# Patient Record
Sex: Female | Born: 1955
Health system: Southern US, Community
[De-identification: ages and names within clinical notes are randomized; demographics above are authoritative.]

## PROBLEM LIST (undated history)

## (undated) DIAGNOSIS — G4733 Obstructive sleep apnea (adult) (pediatric): Secondary | ICD-10-CM

## (undated) DIAGNOSIS — M797 Fibromyalgia: Secondary | ICD-10-CM

## (undated) DIAGNOSIS — K2960 Other gastritis without bleeding: Secondary | ICD-10-CM

## (undated) DIAGNOSIS — K219 Gastro-esophageal reflux disease without esophagitis: Secondary | ICD-10-CM

## (undated) DIAGNOSIS — K8020 Calculus of gallbladder without cholecystitis without obstruction: Secondary | ICD-10-CM

## (undated) DIAGNOSIS — Z86718 Personal history of other venous thrombosis and embolism: Secondary | ICD-10-CM

## (undated) DIAGNOSIS — K08109 Complete loss of teeth, unspecified cause, unspecified class: Secondary | ICD-10-CM

## (undated) DIAGNOSIS — R1310 Dysphagia, unspecified: Secondary | ICD-10-CM

## (undated) DIAGNOSIS — Z972 Presence of dental prosthetic device (complete) (partial): Secondary | ICD-10-CM

## (undated) DIAGNOSIS — H7409 Tympanosclerosis, unspecified ear: Secondary | ICD-10-CM

## (undated) DIAGNOSIS — G4700 Insomnia, unspecified: Secondary | ICD-10-CM

## (undated) DIAGNOSIS — R000 Tachycardia, unspecified: Secondary | ICD-10-CM

## (undated) DIAGNOSIS — I209 Angina pectoris, unspecified: Secondary | ICD-10-CM

## (undated) DIAGNOSIS — E785 Hyperlipidemia, unspecified: Secondary | ICD-10-CM

## (undated) DIAGNOSIS — K581 Irritable bowel syndrome with constipation: Secondary | ICD-10-CM

## (undated) DIAGNOSIS — M1990 Unspecified osteoarthritis, unspecified site: Secondary | ICD-10-CM

## (undated) DIAGNOSIS — R0789 Other chest pain: Secondary | ICD-10-CM

## (undated) DIAGNOSIS — F419 Anxiety disorder, unspecified: Secondary | ICD-10-CM

## (undated) DIAGNOSIS — I499 Cardiac arrhythmia, unspecified: Secondary | ICD-10-CM

## (undated) HISTORY — PX: OTHER SURGICAL HISTORY: SHX169

## (undated) HISTORY — PX: ABDOMINAL HYSTERECTOMY: SHX81

---

## 1996-08-24 HISTORY — PX: TOTAL VAGINAL HYSTERECTOMY: SHX2548

## 2013-08-24 HISTORY — PX: JOINT REPLACEMENT: SHX530

## 2013-08-24 HISTORY — PX: TOTAL KNEE ARTHROPLASTY: SHX125

## 2016-08-19 ENCOUNTER — Emergency Department (HOSPITAL_BASED_OUTPATIENT_CLINIC_OR_DEPARTMENT_OTHER)
Admission: EM | Admit: 2016-08-19 | Discharge: 2016-08-20 | Disposition: A | Discharge status: Home / Self Care | Payer: BLUE CROSS/BLUE SHIELD | Attending: Emergency Medicine | Admitting: Emergency Medicine

## 2016-08-19 ENCOUNTER — Emergency Department (HOSPITAL_BASED_OUTPATIENT_CLINIC_OR_DEPARTMENT_OTHER): Payer: BLUE CROSS/BLUE SHIELD

## 2016-08-19 ENCOUNTER — Encounter (HOSPITAL_BASED_OUTPATIENT_CLINIC_OR_DEPARTMENT_OTHER): Payer: Self-pay

## 2016-08-19 DIAGNOSIS — S0990XA Unspecified injury of head, initial encounter: Secondary | ICD-10-CM | POA: Diagnosis present

## 2016-08-19 DIAGNOSIS — S0101XA Laceration without foreign body of scalp, initial encounter: Secondary | ICD-10-CM | POA: Insufficient documentation

## 2016-08-19 DIAGNOSIS — W108XXA Fall (on) (from) other stairs and steps, initial encounter: Secondary | ICD-10-CM | POA: Insufficient documentation

## 2016-08-19 DIAGNOSIS — Y999 Unspecified external cause status: Secondary | ICD-10-CM | POA: Insufficient documentation

## 2016-08-19 DIAGNOSIS — Z96651 Presence of right artificial knee joint: Secondary | ICD-10-CM | POA: Insufficient documentation

## 2016-08-19 DIAGNOSIS — S82861A Displaced Maisonneuve's fracture of right leg, initial encounter for closed fracture: Secondary | ICD-10-CM

## 2016-08-19 DIAGNOSIS — Y929 Unspecified place or not applicable: Secondary | ICD-10-CM | POA: Diagnosis not present

## 2016-08-19 DIAGNOSIS — Y939 Activity, unspecified: Secondary | ICD-10-CM | POA: Insufficient documentation

## 2016-08-19 DIAGNOSIS — S60511A Abrasion of right hand, initial encounter: Secondary | ICD-10-CM | POA: Insufficient documentation

## 2016-08-19 DIAGNOSIS — W19XXXA Unspecified fall, initial encounter: Secondary | ICD-10-CM

## 2016-08-19 HISTORY — DX: Fibromyalgia: M79.7

## 2016-08-19 HISTORY — DX: Tachycardia, unspecified: R00.0

## 2016-08-19 MED ORDER — LIDOCAINE-EPINEPHRINE (PF) 2 %-1:200000 IJ SOLN: 10.0000 mL | Freq: Once | INTRAMUSCULAR | Status: DC

## 2016-08-19 MED ORDER — OXYCODONE HCL 5 MG PO TABS: 5.0000 mg | ORAL_TABLET | ORAL | 0 refills | Status: DC | PRN

## 2016-08-19 MED ORDER — LIDOCAINE HCL (PF) 2 % IJ SOLN
INTRAMUSCULAR | Status: AC
  Filled 2016-08-19: qty 2

## 2016-08-19 MED ORDER — ACETAMINOPHEN 500 MG PO TABS
1000.0000 mg | ORAL_TABLET | Freq: Once | ORAL | Status: AC
  Administered 2016-08-19: 1000 mg via ORAL
  Filled 2016-08-19: qty 2

## 2016-08-19 MED ORDER — LIDOCAINE HCL 2 % IJ SOLN
INTRAMUSCULAR | Status: AC
  Filled 2016-08-19: qty 20

## 2016-08-19 MED ORDER — LIDOCAINE-EPINEPHRINE-TETRACAINE (LET) SOLUTION
NASAL | Status: AC
  Filled 2016-08-19: qty 3

## 2016-08-19 NOTE — ED Notes (Signed)
Patient transported to X-ray 

## 2016-08-19 NOTE — ED Triage Notes (Signed)
Pt fell down approximately two steps and skinned her knees and hit the top of her head around 1900.  No LOC, not on blood thinners, has an inch long laceration to top of scalp.

## 2016-08-19 NOTE — ED Notes (Signed)
ED Provider at bedside. 

## 2016-08-19 NOTE — ED Provider Notes (Addendum)
[facility] Provider Note   CSN: [id number] Arrival date & time: 08/19/16  2036  By signing my name below, I, Elizabeth NeighborsMaurice Deon Copeland Jr., attest that this documentation has been prepared under the direction and in the presence of Lyndal Pulleyaniel Kayvon Mo, MD. Electronically signed: [name], ED Scribe. 08/19/16. 9:34 PM.    History   Chief Complaint Chief Complaint  Patient presents with  . Fall    HPI . HPI Comments: Elizabeth Conner is a 60 y.o. female who presents to the Emergency Department for evaluation of a head injury s/p mechanical fall. Pt states that she tripped down stairs x2 hours ago. She states that she landed head first onto the cement pavement and upon impact she saw "bright spots." Pt reports nausea, headache and neck pain. No vomiting, no confusion, no deficits. Also has right knee pain after the fall but has been ambulatory. Pt has taken Ibuprofen with no relief. Pt denies LOC, blood thinner use.    The history is provided by the patient. No language interpreter was used.    Past Medical History:  Diagnosis Date  . Fibromyalgia   . Tachycardia     There are no active problems to display for this patient.   Past Surgical History:  Procedure Laterality Date  . ABDOMINAL HYSTERECTOMY    . CESAREAN SECTION    . JOINT REPLACEMENT  2015   right knee    OB History    No data available       Home Medications    Prior to Admission medications   Not on File    Family History No family history on file.  Social History Social History  Substance Use Topics  . Smoking status: Never Smoker  . Smokeless tobacco: Never Used  . Alcohol use No     Allergies   Patient has no known allergies.   Review of Systems Review of Systems  Constitutional: Negative for chills and fever.  HENT: Negative for ear pain and sore throat.   Eyes: Negative for pain and visual disturbance.  Respiratory: Negative for cough and shortness of breath.    Cardiovascular: Negative for chest pain and palpitations.  Gastrointestinal: Positive for nausea. Negative for abdominal pain and vomiting.  Genitourinary: Negative for dysuria and hematuria.  Musculoskeletal: Positive for neck pain. Negative for arthralgias and back pain.  Skin: Positive for wound. Negative for color change and rash.  Neurological: Positive for headaches. Negative for seizures and syncope.  All other systems reviewed and are negative.    Physical Exam Updated Vital Signs BP 137/79 (BP Location: Left Arm)   Pulse 88   Temp 98.7 F (37.1 C) (Oral)   Resp 18   Ht 5\' 2"  (1.575 m)   Wt 150 lb (68 kg)   SpO2 100%   BMI 27.44 kg/m   Physical Exam  Constitutional: She is oriented to person, place, and time. She appears well-developed and well-nourished. No distress.  HENT:  Head: Normocephalic. Head is with laceration (to superior scalp, 2 cm without underlying hematoma ).  Nose: Nose normal.  Eyes: Conjunctivae and EOM are normal.  Neck: Neck supple. No tracheal deviation present.  Cardiovascular: Normal rate and regular rhythm.   Pulmonary/Chest: Effort normal. No respiratory distress.  Abdominal: Soft. She exhibits no distension.  Musculoskeletal:  Right lateral knee tenderness  Neurological: She is alert and oriented to person, place, and time. No cranial nerve deficit or sensory deficit. She exhibits normal muscle tone.  Skin:  Skin is warm and dry. Abrasion noted.  Bilateral anterior knee abrasions, scattered abrasions to the R hand with a lateral aspect abrasion of the R pinky that is 2 cm across.  Psychiatric: She has a normal mood and affect.     ED Treatments / Results   DIAGNOSTIC STUDIES: Oxygen Saturation is 100% on RA, normal by my interpretation.   COORDINATION OF CARE: 9:35 PM-Discussed next steps with pt. Pt verbalized understanding and is agreeable with the plan.    Labs (all labs ordered are listed, but only abnormal results are  displayed) Labs Reviewed - No data to display  EKG  EKG Interpretation None       Radiology Dg Knee Complete 4 Views Right  Result Date: 08/19/2016 CLINICAL DATA:  Right knee pain after fall tonight down stairs. EXAM: RIGHT KNEE - COMPLETE 4+ VIEW COMPARISON:  None. FINDINGS: Status post right total knee arthroplasty. The femoral and tibial components appear to be well situated. Probable minimally displaced fracture is seen involving the proximal right fibular head. No dislocation or joint effusion is noted. No soft tissue abnormality is noted. IMPRESSION: Status post right total knee arthroplasty. Probable minimally displaced fracture seen involving proximal right fibular head. Electronically Signed   By: Lupita Raider, M.D.   On: 08/19/2016 22:02    Procedures Procedures (including critical care time)   LACERATION REPAIR Performed by: Lyndal Pulley Authorized by: Lyndal Pulley Consent: Verbal consent obtained. Risks and benefits: risks, benefits and alternatives were discussed Consent given by: patient Patient identity confirmed: provided demographic data Prepped and Draped in normal sterile fashion Wound explored  Laceration Location: superior scalp  Laceration Length: 2 cm  No Foreign Bodies seen or palpated  Anesthesia: local infiltration  Local anesthetic: lidocaine 2 % wo epinephrine  Anesthetic total: 5 ml  Irrigation method: syringe Amount of cleaning: standard  Skin closure: staples  Number of sutures: 2  Technique: simple  Patient tolerance: Patient tolerated the procedure well with no immediate complications.   Medications Ordered in ED Medications  lidocaine-EPINEPHrine (XYLOCAINE W/EPI) 2 %-1:200000 (PF) injection 10 mL (not administered)  lidocaine (XYLOCAINE) 2 % injection (not administered)  lidocaine (XYLOCAINE) 2 % (with pres) injection (not administered)  acetaminophen (TYLENOL) tablet 1,000 mg (1,000 mg Oral Given 08/19/16 2146)    oxyCODONE (Oxy IR/ROXICODONE) immediate release tablet 5 mg (5 mg Oral Given 08/20/16 0006)     Initial Impression / Assessment and Plan / ED Course  I have reviewed the triage vital signs and the nursing notes.  Pertinent labs & imaging results that were available during my care of the patient were reviewed by me and considered in my medical decision making (see chart for details).  Clinical Course     60 y.o. female presents with fall after missing a step to concrete. No LOC. No scalp hematoma but sustained small laceration. Has ho b/l knee replacement and now has right knee pain. Small proximal fibular fracture noted on plain film. Considering pt's GCS of 15, lack of scalp hematoma, lack of evidence of skull base fracture, and lack of vomiting or altered mental status, I do not believe that further imaging is warranted in this pt.  Pt has no neurologic deficit.  Laceration was thoroughly irrigated and repaired without complication.  Pt is to follow up with healthcare provider in ten days for removal of staples.  Proper wound management counseling was administered. 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 Placed in  knee immobilizer for comfort. Return precautions discussed for worsening or new concerning symptoms.   Final Clinical Impressions(s) / ED Diagnoses   Final diagnoses:  Laceration of scalp, initial encounter  Fall from standing, initial encounter  Closed displaced Maisonneuve fracture of right lower extremity, initial encounter    New Prescriptions New Prescriptions   No medications on file   I personally performed the services described in this documentation, which was scribed in my presence. The recorded information has been reviewed and is accurate.      Lyndal Pulleyaniel Jaice Lague, MD 08/20/16 [id number]    Lyndal Pulleyaniel Aulton Routt, MD 08/20/16 [phone]

## 2016-08-20 MED ORDER — OXYCODONE HCL 5 MG PO TABS
5.0000 mg | ORAL_TABLET | Freq: Once | ORAL | Status: AC
  Administered 2016-08-20: 5 mg via ORAL
  Filled 2016-08-20: qty 1

## 2020-02-22 ENCOUNTER — Emergency Department (HOSPITAL_BASED_OUTPATIENT_CLINIC_OR_DEPARTMENT_OTHER): Payer: BLUE CROSS/BLUE SHIELD

## 2020-02-22 ENCOUNTER — Other Ambulatory Visit: Payer: Self-pay

## 2020-02-22 ENCOUNTER — Emergency Department (HOSPITAL_BASED_OUTPATIENT_CLINIC_OR_DEPARTMENT_OTHER)
Admission: EM | Admit: 2020-02-22 | Discharge: 2020-02-22 | Disposition: A | Discharge status: Home / Self Care | Payer: BLUE CROSS/BLUE SHIELD | Attending: Emergency Medicine | Admitting: Emergency Medicine

## 2020-02-22 ENCOUNTER — Encounter (HOSPITAL_BASED_OUTPATIENT_CLINIC_OR_DEPARTMENT_OTHER): Payer: Self-pay

## 2020-02-22 DIAGNOSIS — R0602 Shortness of breath: Secondary | ICD-10-CM | POA: Insufficient documentation

## 2020-02-22 DIAGNOSIS — E785 Hyperlipidemia, unspecified: Secondary | ICD-10-CM | POA: Insufficient documentation

## 2020-02-22 DIAGNOSIS — R079 Chest pain, unspecified: Secondary | ICD-10-CM | POA: Diagnosis not present

## 2020-02-22 DIAGNOSIS — R42 Dizziness and giddiness: Secondary | ICD-10-CM | POA: Diagnosis not present

## 2020-02-22 HISTORY — DX: Hyperlipidemia, unspecified: E78.5

## 2020-02-22 HISTORY — DX: Dysphagia, unspecified: R13.10

## 2020-02-22 HISTORY — DX: Tympanosclerosis, unspecified ear: H74.09

## 2020-02-22 HISTORY — DX: Unspecified osteoarthritis, unspecified site: M19.90

## 2020-02-22 HISTORY — DX: Other gastritis without bleeding: K29.60

## 2020-02-22 HISTORY — DX: Insomnia, unspecified: G47.00

## 2020-02-22 HISTORY — DX: Obstructive sleep apnea (adult) (pediatric): G47.33

## 2020-02-22 LAB — CBC WITH DIFFERENTIAL/PLATELET
Abs Immature Granulocytes: 0.03 10*3/uL (ref 0.00–0.07)
Basophils Absolute: 0.1 10*3/uL (ref 0.0–0.1)
Basophils Relative: 1 %
Eosinophils Absolute: 0.1 10*3/uL (ref 0.0–0.5)
Eosinophils Relative: 1 %
HCT: 41.9 % (ref 36.0–46.0)
Hemoglobin: 13.7 g/dL (ref 12.0–15.0)
Immature Granulocytes: 0 %
Lymphocytes Relative: 34 %
Lymphs Abs: 2.7 10*3/uL (ref 0.7–4.0)
MCH: 30.2 pg (ref 26.0–34.0)
MCHC: 32.7 g/dL (ref 30.0–36.0)
MCV: 92.5 fL (ref 80.0–100.0)
Monocytes Absolute: 0.6 10*3/uL (ref 0.1–1.0)
Monocytes Relative: 8 %
Neutro Abs: 4.5 10*3/uL (ref 1.7–7.7)
Neutrophils Relative %: 56 %
Platelets: 321 10*3/uL (ref 150–400)
RBC: 4.53 MIL/uL (ref 3.87–5.11)
RDW: 12.5 % (ref 11.5–15.5)
WBC: 8 10*3/uL (ref 4.0–10.5)
nRBC: 0 % (ref 0.0–0.2)

## 2020-02-22 LAB — COMPREHENSIVE METABOLIC PANEL
ALT: 19 U/L (ref 0–44)
AST: 20 U/L (ref 15–41)
Albumin: 4 g/dL (ref 3.5–5.0)
Alkaline Phosphatase: 71 U/L (ref 38–126)
Anion gap: 11 (ref 5–15)
BUN: 10 mg/dL (ref 8–23)
CO2: 24 mmol/L (ref 22–32)
Calcium: 9.2 mg/dL (ref 8.9–10.3)
Chloride: 103 mmol/L (ref 98–111)
Creatinine, Ser: 0.72 mg/dL (ref 0.44–1.00)
GFR calc Af Amer: >60 mL/min (ref >60)
GFR calc non Af Amer: >60 mL/min (ref >60)
Glucose, Bld: 87 mg/dL (ref 70–99)
Potassium: 4.3 mmol/L (ref 3.5–5.1)
Sodium: 138 mmol/L (ref 135–145)
Total Bilirubin: 0.7 mg/dL (ref 0.3–1.2)
Total Protein: 7.7 g/dL (ref 6.5–8.1)

## 2020-02-22 LAB — URINALYSIS, ROUTINE W REFLEX MICROSCOPIC
Bilirubin Urine: NEGATIVE
Glucose, UA: NEGATIVE mg/dL
Ketones, ur: NEGATIVE mg/dL
Leukocytes,Ua: NEGATIVE
Nitrite: NEGATIVE
Protein, ur: NEGATIVE mg/dL
Specific Gravity, Urine: 1.025 (ref 1.005–1.030)
pH: 5.5 (ref 5.0–8.0)

## 2020-02-22 LAB — URINALYSIS, MICROSCOPIC (REFLEX)

## 2020-02-22 LAB — TROPONIN I (HIGH SENSITIVITY): Troponin I (High Sensitivity): <2 ng/L (ref <18)

## 2020-02-22 MED ORDER — MECLIZINE HCL 25 MG PO TABS: 25.0000 mg | ORAL_TABLET | Freq: Three times a day (TID) | ORAL | 0 refills | Status: DC | PRN

## 2020-02-22 MED FILL — MECLIZINE 25 MG TABLET: 25 | 3 days supply | Qty: 10 | Fill #0

## 2020-02-22 NOTE — ED Triage Notes (Signed)
Pt c/o dizziness, SOB x 1 week-states she was sent from [facility]-steady gait

## 2020-02-22 NOTE — ED Notes (Signed)
Patient transported to X-ray 

## 2020-02-22 NOTE — Discharge Instructions (Addendum)
You were seen in the emergency department today for dizziness, chest pain, and trouble breathing.  Your work-up was overall reassuring.  Your labs did not show any significant abnormalities.  Your head CT did not show any significant acute abnormalities and elevated your chest x-ray.  We are unsure of the exact cause of your multiple symptoms.  Would like you to try taking meclizine every 8 hours as needed to help with dizziness, this does not seem to help with your symptoms please discontinue taking it.  We have prescribed you new medication(s) today. Discuss the medications prescribed today with your pharmacist as they can have adverse effects and interactions with your other medicines including over the counter and prescribed medications. Seek medical evaluation if you start to experience new or abnormal symptoms after taking one of these medicines, seek care immediately if you start to experience difficulty breathing, feeling of your throat closing, facial swelling, or rash as these could be indications of a more serious allergic reaction  Please be sure to stay very well-hydrated.  We would like you to follow-up with a cardiologist as well as a neurologist in addition to your primary care provider. Return to the ED for new or worsening symptoms including but not limited to worsened pain, persistent pain, increased work of breathing, constant dizziness, passing out, fever, abdominal pain, or any other concerns.

## 2020-02-22 NOTE — ED Notes (Signed)
ED Provider at bedside. 

## 2020-02-22 NOTE — ED Notes (Signed)
Patient transported to CT 

## 2020-02-22 NOTE — ED Provider Notes (Signed)
[facility] EMERGENCY DEPARTMENT Provider Note   CSN: [id number] Arrival date & time: 02/22/20  1443     History Chief Complaint  Patient presents with  . Dizziness    Elizabeth Conner is a 64 y.o. female with a history fibromyalgia, hyperlipidemia, insomnia, and OSA on CPAP who presents to the emergency with multiple complaints including chest discomfort, dyspnea, and dizziness which have been occurring for a few years now, worse over the past few weeks.  Patient states that she has dizziness that sometimes feels like she is off balance, sometimes feels like the room spinning, and sometimes feels like lightheadedness.  This seems worse with position changes and with ambulation.  No significant alleviating factors.  She has fallen a few times related to this dizziness.  She also mentions some chest discomfort which occurs almost daily now, it is to the left chest, it is difficult to describe, nonradiating, lasts a few minutes to hours at a time.  This is not associated with exertion or a deep breath.  She sometimes feels short of breath with the chest discomfort.  The dizziness, chest discomfort, and shortness of breath have been occurring for several years, seems worse over the past 2 to 3 weeks which prompted an urgent care visit and ultimate referral to the emergency department.  She also mentions some stuttering which has been occurring for several months now too.  She denies fever, syncope, vomiting, diaphoresis, visual disturbance, focal numbness/weakness, leg pain/swelling, hemoptysis, recent surgery/trauma, recent long travel, hormone use, or hx of cancer.     HPI     Past Medical History:  Diagnosis Date  . Dysphagia   . Erosive gastritis   . Fibromyalgia   . Hearing loss   . Hyperlipemia   . Insomnia   . OSA on CPAP   . Osteoarthritis   . SOB (shortness of breath)   . Tachycardia   . Tympanosclerosis     There are no problems to display for this patient.   Past  Surgical History:  Procedure Laterality Date  . ABDOMINAL HYSTERECTOMY    . CESAREAN SECTION    . JOINT REPLACEMENT  2015   right knee     OB History   No obstetric history on file.     No family history on file.  Social History   Tobacco Use  . Smoking status: Never Smoker  . Smokeless tobacco: Never Used  Vaping Use  . Vaping Use: Never used  Substance Use Topics  . Alcohol use: No  . Drug use: Never    Home Medications Prior to Admission medications   Medication Sig Start Date End Date Taking? Authorizing Provider  oxyCODONE (ROXICODONE) 5 MG immediate release tablet Take 1 tablet (5 mg total) by mouth every 4 (four) hours as needed for severe pain. 08/19/16   Lyndal Pulley, MD    Allergies    Patient has no known allergies.  Review of Systems   Review of Systems  Constitutional: Negative for chills, diaphoresis and fever.  Eyes: Negative for visual disturbance.  Respiratory: Positive for shortness of breath.   Cardiovascular: Positive for chest pain.  Gastrointestinal: Negative for abdominal pain and vomiting.  Neurological: Positive for dizziness and speech difficulty. Negative for syncope, weakness, numbness and headaches.  All other systems reviewed and are negative.  Physical Exam Updated Vital Signs BP 119/76 (BP Location: Left Arm)   Pulse 93   Temp 98 F (36.7 C) (Oral)   Resp 18  Ht 5\' 2"  (1.575 m)   Wt 77.1 kg   SpO2 97%   BMI 31.09 kg/m   Physical Exam Vitals and nursing note reviewed.  Constitutional:      General: She is not in acute distress.    Appearance: Normal appearance. She is not toxic-appearing.  HENT:     Head: Normocephalic and atraumatic.     Mouth/Throat:     Pharynx: Oropharynx is clear. Uvula midline.  Eyes:     General: Vision grossly intact. Gaze aligned appropriately.     Extraocular Movements: Extraocular movements intact.     Conjunctiva/sclera: Conjunctivae normal.     Pupils: Pupils are equal, round, and  reactive to light.     Comments: No proptosis.   Cardiovascular:     Rate and Rhythm: Normal rate and regular rhythm.     Comments: 2+ symmetric radial and DP pulses bilaterally. Pulmonary:     Effort: Pulmonary effort is normal.     Breath sounds: Normal breath sounds.  Abdominal:     General: There is no distension.     Palpations: Abdomen is soft.     Tenderness: There is no abdominal tenderness. There is no guarding or rebound.  Musculoskeletal:        General: No tenderness.     Cervical back: Normal range of motion and neck supple. No rigidity.     Right lower leg: No edema.     Left lower leg: No edema.  Skin:    General: Skin is warm and dry.  Neurological:     Mental Status: She is alert.     Comments: Alert. Clear speech.  No aphasia, dysphagia, or word finding difficulty noted on exam no facial droop. CNIII-XII grossly intact. Bilateral upper and lower extremities' sensation grossly intact. 5/5 symmetric strength with grip strength and with plantar and dorsi flexion bilaterally . Normal finger to nose bilaterally. Negative pronator drift.  Patient does seem a little unsteady with ambulation but she is not significantly ataxic.   Psychiatric:        Mood and Affect: Mood normal.        Behavior: Behavior normal.    ED Results / Procedures / Treatments   Labs (all labs ordered are listed, but only abnormal results are displayed) Labs Reviewed  URINALYSIS, ROUTINE W REFLEX MICROSCOPIC - Abnormal; Notable for the following components:      Result Value   Hgb urine dipstick TRACE (*)    All other components within normal limits  URINALYSIS, MICROSCOPIC (REFLEX) - Abnormal; Notable for the following components:   Bacteria, UA MANY (*)    All other components within normal limits  CBC WITH DIFFERENTIAL/PLATELET  COMPREHENSIVE METABOLIC PANEL  TROPONIN I (HIGH SENSITIVITY)    EKG EKG Interpretation  Date/Time:  Thursday February 22 2020 14:46:02 EDT Ventricular Rate:   102 PR Interval:  128 QRS Duration: 82 QT Interval:  352 QTC Calculation: 458 R Axis:   35 Text Interpretation: Sinus tachycardia Cannot rule out Anterior infarct , age undetermined Abnormal ECG No previous ECGs available Confirmed by Frederick PeersLittle, Rachel [phone]([id number]) on 02/22/2020 3:07:59 PM   Radiology DG Chest 2 View  Result Date: 02/22/2020 CLINICAL DATA:  Chest pain. Additional provided: Dizziness, shortness of breath for 1 week. EXAM: CHEST - 2 VIEW COMPARISON:  Prior chest radiograph 10/19/2018 FINDINGS: Heart size within normal limits. Aortic atherosclerosis. There is no appreciable airspace consolidation. No evidence of pleural effusion or pneumothorax. No acute bony abnormality identified.  Thoracic  spondylosis. IMPRESSION: No evidence of acute cardiopulmonary abnormality. Aortic Atherosclerosis (ICD10-I70.0). Thoracic spondylosis. Electronically Signed   By: Jackey Loge DO   On: 02/22/2020 16:04   CT Head Wo Contrast  Result Date: 02/22/2020 CLINICAL DATA:  Dizziness EXAM: CT HEAD WITHOUT CONTRAST TECHNIQUE: Contiguous axial images were obtained from the base of the skull through the vertex without intravenous contrast. COMPARISON:  None. FINDINGS: Brain: There is no acute intracranial hemorrhage, mass effect, or edema. Gray-white differentiation is preserved. There is no extra-axial fluid collection. Ventricles and sulci are within normal limits in size and configuration. Foci of low attenuation in the subinsular white matter bilaterally may reflect prominent perivascular spaces or chronic small vessel infarcts. Vascular: There is minor atherosclerotic calcification at the skull base. Skull: Calvarium is unremarkable. Sinuses/Orbits: No acute finding. Other: None. IMPRESSION: No acute intracranial hemorrhage, mass effect, or evidence of acute infarction. Electronically Signed   By: Guadlupe Spanish M.D.   On: 02/22/2020 16:18   Procedures Procedures (including critical care time)  Medications  Ordered in ED Medications - No data to display  ED Course  I have reviewed the triage vital signs and the nursing notes.  Pertinent labs & imaging results that were available during my care of the patient were reviewed by me and considered in my medical decision making (see chart for details).    MDM Rules/Calculators/A&P                         Patient presents to the ED with complaints of dizziness, chest pain, & dyspnea intermittently over the past several years, worsening over the past few weeks, sent by UC.  Patient is nontoxic, resting comfortably, her vitals are without significant abnormality.  She has a benign physical exam.  Additional history obtained:  Additional history obtained from chart and nursing note reviewed. EKG: No STEMI. Lab Tests:  I Ordered, reviewed, and interpreted labs, which included:  CBC: No significant anemia or leukocytosis CMP: No significant electrolyte derangement.  Renal function and LFTs are within normal limits Troponin: Within normal limits Urinalysis: Many bacteria, asymptomatic bacteriuria, do not suspect UTI as patient has no urinary symptoms or abdominal discomfort.  Imaging Studies ordered:  I ordered imaging studies which included CXR & CT head wo contrast, I independently visualized and interpreted imaging which showed:  CXR: No evidence of acute cardiopulmonary abnormality. Aortic Atherosclerosis (ICD10-I70.0). Thoracic spondylosis CT head: No acute intracranial hemorrhage, mass effect, or evidence of acute infarction  ED Course:   Patient with overall reassuring exam and work-up. She does seem a bit unsteady with ambulation but is not overtly ataxic.  She has no focal neurologic deficits on exam.  Given her symptoms have been intermittent for a while now, would suspect findings of significant infarct on CT head if her dizziness & reported stuttering were from a CVA. There is no signs of bleed.  She is afebrile without nuchal rigidity to  suggest meningitis.  She has no masses on her head CT.  She has no significant electrolyte derangement or anemia to account for dizziness.  Her orthostatic vitals are reassuring.  Orthostatic VS for the past 24 hrs:  BP- Lying Pulse- Lying BP- Sitting Pulse- Sitting BP- Standing at 0 minutes Pulse- Standing at 0 minutes  02/22/20 1602 119/68 87 116/65 89 112/66 94    We will trial a very short course of meclizine for dizziness with neurology follow-up.  In regards to her chest discomfort, symptoms intermittently  for extended period of time, EKG with no STEMI, troponin is not elevated, low suspicion for ACS requiring emergent evaluation.  Patient is not hypoxic or tachycardic, low risk Wells, doubt pulmonary embolism at this time.  No widened mediastinum on chest x-ray, symmetric pulses, low suspicion for dissection.  Chest x-ray without pneumonia, pneumothorax, or fluid overload. She is ambulatory without hypoxia. Unclear definitive etiology to patient's symptoms, however ongoing for several months at this time, no acute abnormalities on work-up today, she overall appears appropriate for discharge home with outpatient follow-up, will provide information for cardiology and neurology, discussed need for PCP follow-up as well. I discussed results, treatment plan, need for follow-up, and return precautions with the patient. Provided opportunity for questions, patient confirmed understanding and is in agreement with plan.   Findings and plan of care discussed with supervising physician Dr. Clarene Duke who is in agreement.    Portions of this note were generated with Scientist, clinical (histocompatibility and immunogenetics). Dictation errors may occur despite best attempts at proofreading.   Final Clinical Impression(s) / ED Diagnoses Final diagnoses:  Dizziness  Chest pain, unspecified type    Rx / DC Orders ED Discharge Orders         Ordered    meclizine (ANTIVERT) 25 MG tablet  3 times daily PRN     Discontinue  Reprint      02/22/20 1714           Cherly Anderson, PA-C 02/22/20 1727    Little, Ambrose Finland, MD 02/26/20 [phone]

## 2020-04-16 ENCOUNTER — Emergency Department (HOSPITAL_BASED_OUTPATIENT_CLINIC_OR_DEPARTMENT_OTHER)
Admission: EM | Admit: 2020-04-16 | Discharge: 2020-04-16 | Disposition: A | Discharge status: Home / Self Care | Payer: BLUE CROSS/BLUE SHIELD | Attending: Emergency Medicine | Admitting: Emergency Medicine

## 2020-04-16 ENCOUNTER — Emergency Department (HOSPITAL_BASED_OUTPATIENT_CLINIC_OR_DEPARTMENT_OTHER): Payer: BLUE CROSS/BLUE SHIELD

## 2020-04-16 ENCOUNTER — Other Ambulatory Visit: Payer: Self-pay

## 2020-04-16 ENCOUNTER — Encounter (HOSPITAL_BASED_OUTPATIENT_CLINIC_OR_DEPARTMENT_OTHER): Payer: Self-pay

## 2020-04-16 DIAGNOSIS — R000 Tachycardia, unspecified: Secondary | ICD-10-CM | POA: Insufficient documentation

## 2020-04-16 DIAGNOSIS — R110 Nausea: Secondary | ICD-10-CM | POA: Diagnosis not present

## 2020-04-16 DIAGNOSIS — R1011 Right upper quadrant pain: Secondary | ICD-10-CM

## 2020-04-16 DIAGNOSIS — M549 Dorsalgia, unspecified: Secondary | ICD-10-CM | POA: Diagnosis not present

## 2020-04-16 LAB — CBC
HCT: 44.7 % (ref 36.0–46.0)
Hemoglobin: 14.6 g/dL (ref 12.0–15.0)
MCH: 29.9 pg (ref 26.0–34.0)
MCHC: 32.7 g/dL (ref 30.0–36.0)
MCV: 91.6 fL (ref 80.0–100.0)
Platelets: 324 10*3/uL (ref 150–400)
RBC: 4.88 MIL/uL (ref 3.87–5.11)
RDW: 12.6 % (ref 11.5–15.5)
WBC: 8 10*3/uL (ref 4.0–10.5)
nRBC: 0 % (ref 0.0–0.2)

## 2020-04-16 LAB — URINALYSIS, ROUTINE W REFLEX MICROSCOPIC
Bilirubin Urine: NEGATIVE
Glucose, UA: NEGATIVE mg/dL
Hgb urine dipstick: NEGATIVE
Ketones, ur: NEGATIVE mg/dL
Leukocytes,Ua: NEGATIVE
Nitrite: NEGATIVE
Protein, ur: NEGATIVE mg/dL
Specific Gravity, Urine: 1.015 (ref 1.005–1.030)
pH: 7 (ref 5.0–8.0)

## 2020-04-16 LAB — LIPASE, BLOOD: Lipase: 32 U/L (ref 11–51)

## 2020-04-16 LAB — COMPREHENSIVE METABOLIC PANEL
ALT: 274 U/L — ABNORMAL HIGH (ref 0–44)
AST: 433 U/L — ABNORMAL HIGH (ref 15–41)
Albumin: 4 g/dL (ref 3.5–5.0)
Alkaline Phosphatase: 97 U/L (ref 38–126)
Anion gap: 10 (ref 5–15)
BUN: 14 mg/dL (ref 8–23)
CO2: 27 mmol/L (ref 22–32)
Calcium: 10.7 mg/dL — ABNORMAL HIGH (ref 8.9–10.3)
Chloride: 101 mmol/L (ref 98–111)
Creatinine, Ser: 0.93 mg/dL (ref 0.44–1.00)
GFR calc Af Amer: >60 mL/min (ref >60)
GFR calc non Af Amer: >60 mL/min (ref >60)
Glucose, Bld: 108 mg/dL — ABNORMAL HIGH (ref 70–99)
Potassium: 4.6 mmol/L (ref 3.5–5.1)
Sodium: 138 mmol/L (ref 135–145)
Total Bilirubin: 1.4 mg/dL — ABNORMAL HIGH (ref 0.3–1.2)
Total Protein: 7.6 g/dL (ref 6.5–8.1)

## 2020-04-16 MED ORDER — HYDROMORPHONE HCL 1 MG/ML IJ SOLN
0.5000 mg | Freq: Once | INTRAMUSCULAR | Status: AC
  Administered 2020-04-16: 0.5 mg via INTRAMUSCULAR
  Filled 2020-04-16: qty 1

## 2020-04-16 MED ORDER — ONDANSETRON 4 MG PO TBDP: 4.0000 mg | ORAL_TABLET | ORAL | 0 refills | Status: DC | PRN

## 2020-04-16 MED ORDER — HYDROCODONE-ACETAMINOPHEN 5-325 MG PO TABS: 1.0000 | ORAL_TABLET | Freq: Four times a day (QID) | ORAL | 0 refills | Status: DC | PRN

## 2020-04-16 MED FILL — ONDANSETRON ODT 4 MG TABLET: 4 | 3 days supply | Qty: 20 | Fill #0

## 2020-04-16 MED FILL — HYDROCODON-APAP 5-325: 5-325 | 4 days supply | Qty: 30 | Fill #0

## 2020-04-16 NOTE — ED Notes (Signed)
ED Provider at bedside. 

## 2020-04-16 NOTE — ED Notes (Signed)
Pt endorses having driver r/t receiving pain medication

## 2020-04-16 NOTE — Discharge Instructions (Addendum)
1.  Have a liquid diet only for the next 24 hours.  If you need some food on your stomach, you may eat very bland foods with no fat in them such as salted soda crackers or rice without oils.  It is very important you avoid all fat so as not to stimulate your gallbladder to contract and cause pain episodes. 2.  You may take 1-2 Percocet tablets every 6 hours as needed for pain.  You have been given a prescription for Zofran to take for nausea. 3.  You have a follow-up appointment tomorrow at 1:50pm at [facility] surgery with Dr. Sheliah Hatch.  No to this appointment for further instructions and evaluation. 4.  If you develop worsening pain, fever, vomiting or generally worsening symptoms overnight, go to [facility] or [facility] emergency department for admission to the hospital.

## 2020-04-16 NOTE — ED Triage Notes (Addendum)
Pt arrives with c/o pain to RUQ during the night. Pt reprots she has had this "attack before but it has gone away, this time it didn't go away". Pt reports some nausea, denies nausea, diarrhea. Pt reports taking 2 tylenol PTA which did "calm the pain some".

## 2020-04-16 NOTE — ED Notes (Signed)
Pt transported to [facility]

## 2020-04-16 NOTE — ED Provider Notes (Signed)
Lan: Consult general surgery. Physical Exam  BP 128/63 (BP Location: Right Arm)   Pulse 94   Temp 98.6 F (37 C) (Oral)   Resp 18   Ht 5\' 2"  (1.575 m)   Wt 74.8 kg   SpO2 99%   BMI 30.18 kg/m   Physical Exam Alert and appropriate.  No respiratory distress.  No acute distress. ED Course/Procedures     Procedures  MDM  Consult: Reviewed Will .  We have reviewed the diagnostic studies and patient condition.  Patient does not have pain at this time.  Scheduling has been arranged for follow-up appointment tomorrow at 1:40 PM.  Patient is counseled on careful return precautions if symptoms are worsening.       Marlyne Beards, MD 04/16/20 1651

## 2020-04-16 NOTE — ED Provider Notes (Signed)
[facility] EMERGENCY DEPARTMENT Provider Note   CSN: [id number] Arrival date & time: 04/16/20  1214     History Chief Complaint  Patient presents with  . Abdominal Pain    Elizabeth Conner is a 64 y.o. female.  HPI 64 year old female presents with upper abdominal pain.  Started last night around 11 PM.  It was pretty severe for multiple hours.  Right now it is more like a 4 out of 10.  Feels like a dull pain.  Radiates across her abdomen into her back.  No vomiting but she felt nauseated.  She had multiple bowel movements but no diarrhea.  Has had pain like this a couple times before that always seems to go away.  She has a weird taste in her mouth.  No chest pain or shortness of breath.   Past Medical History:  Diagnosis Date  . Dysphagia   . Erosive gastritis   . Fibromyalgia   . Hearing loss   . Hyperlipemia   . Insomnia   . OSA on CPAP   . Osteoarthritis   . SOB (shortness of breath)   . Tachycardia   . Tympanosclerosis     There are no problems to display for this patient.   Past Surgical History:  Procedure Laterality Date  . ABDOMINAL HYSTERECTOMY    . CESAREAN SECTION    . JOINT REPLACEMENT  2015   right knee     OB History   No obstetric history on file.     No family history on file.  Social History   Tobacco Use  . Smoking status: Never Smoker  . Smokeless tobacco: Never Used  Vaping Use  . Vaping Use: Never used  Substance Use Topics  . Alcohol use: No  . Drug use: Never    Home Medications Prior to Admission medications   Medication Sig Start Date End Date Taking? Authorizing Provider  cyanocobalamin 1000 MCG tablet Take by mouth. 04/11/20  Yes [provider]  DULoxetine (CYMBALTA) 60 MG capsule Take by mouth. 04/04/20  Yes [provider]  amitriptyline (ELAVIL) 50 MG tablet Take 100 mg by mouth at bedtime. 12/10/19   [provider]  estradiol (ESTRACE) 1 MG tablet Take 2 mg by mouth daily. 02/26/20    [provider]  meclizine (ANTIVERT) 25 MG tablet Take 1 tablet (25 mg total) by mouth 3 (three) times daily as needed for dizziness. 02/22/20   Petrucelli, Samantha R, PA-C  oxyCODONE (ROXICODONE) 5 MG immediate release tablet Take 1 tablet (5 mg total) by mouth every 4 (four) hours as needed for severe pain. 08/19/16   Lyndal Pulley, MD    Allergies    Patient has no known allergies.  Review of Systems   Review of Systems  Constitutional: Negative for fever.  Respiratory: Negative for shortness of breath.   Cardiovascular: Negative for chest pain.  Gastrointestinal: Positive for abdominal pain and nausea. Negative for diarrhea and vomiting.  Musculoskeletal: Positive for back pain.  All other systems reviewed and are negative.   Physical Exam Updated Vital Signs BP 128/81   Pulse 100   Temp 98.6 F (37 C) (Oral)   Resp 18   Ht 5\' 2"  (1.575 m)   Wt 74.8 kg   SpO2 98%   BMI 30.18 kg/m   Physical Exam Vitals and nursing note reviewed.  Constitutional:      General: She is not in acute distress.    Appearance: She is well-developed. She is  not ill-appearing or diaphoretic.  HENT:     Head: Normocephalic and atraumatic.     Right Ear: External ear normal.     Left Ear: External ear normal.     Nose: Nose normal.  Eyes:     General:        Right eye: No discharge.        Left eye: No discharge.  Cardiovascular:     Rate and Rhythm: Normal rate and regular rhythm.     Heart sounds: Normal heart sounds.  Pulmonary:     Effort: Pulmonary effort is normal.     Breath sounds: Normal breath sounds.  Abdominal:     Palpations: Abdomen is soft.     Tenderness: There is abdominal tenderness in the right upper quadrant. Negative signs include Murphy's sign.  Skin:    General: Skin is warm and dry.  Neurological:     Mental Status: She is alert.  Psychiatric:        Mood and Affect: Mood is not anxious.     ED Results / Procedures / Treatments   Labs (all  labs ordered are listed, but only abnormal results are displayed) Labs Reviewed  COMPREHENSIVE METABOLIC PANEL - Abnormal; Notable for the following components:      Result Value   Glucose, Bld 108 (*)    Calcium 10.7 (*)    AST 433 (*)    ALT 274 (*)    Total Bilirubin 1.4 (*)    All other components within normal limits  URINALYSIS, ROUTINE W REFLEX MICROSCOPIC - Abnormal; Notable for the following components:   APPearance CLOUDY (*)    All other components within normal limits  LIPASE, BLOOD  CBC    EKG EKG Interpretation  Date/Time:  Tuesday April 16 2020 12:57:47 EDT Ventricular Rate:  107 PR Interval:  128 QRS Duration: 80 QT Interval:  324 QTC Calculation: 432 R Axis:   18 Text Interpretation: Sinus tachycardia no acute ST/T changes similar to July 2021 Confirmed by Pricilla Loveless [id number]) on 04/16/2020 1:24:15 PM   Radiology US Abdomen Limited RUQ  Result Date: 04/16/2020 CLINICAL DATA:  Right upper quadrant abdominal pain. EXAM: ULTRASOUND ABDOMEN LIMITED RIGHT UPPER QUADRANT COMPARISON:  December 20, 2018. FINDINGS: Gallbladder: Cholelithiasis is noted with the largest stone measuring 2 cm. No definite gallbladder wall thickening or pericholecystic fluid is noted. Positive sonographic Murphy's sign is noted. Common bile duct: Diameter: 4 mm which is within normal limits. Liver: No focal lesion identified. Increased echogenicity of hepatic parenchyma is noted. Portal vein is patent on color Doppler imaging with normal direction of blood flow towards the liver. Other: None. IMPRESSION: 1. Cholelithiasis is noted without gallbladder wall thickening or pericholecystic fluid, but positive sonographic Murphy's sign is noted. HIDA scan may be performed for further evaluation. 2. Increased echogenicity of hepatic parenchyma is noted suggesting fatty infiltration or other diffuse hepatocellular disease. Electronically Signed   By: Lupita Raider M.D.   On: 04/16/2020 14:16     Procedures Procedures (including critical care time)  Medications Ordered in ED Medications - No data to display  ED Course  I have reviewed the triage vital signs and the nursing notes.  Pertinent labs & imaging results that were available during my care of the patient were reviewed by me and considered in my medical decision making (see chart for details).    MDM Rules/Calculators/A&P  Patient has some moderate tenderness to her right upper quadrant. However she is not vomiting, no fever, normal WBC. However her LFTs are abnormal and mild elevated bilirubin. Given this with large gallstone, I plan to discuss with general surgery to see if she needs to go ahead and have a cholecystectomy. Currently awaiting general surgery consult and care transferred to Dr. Donnald Garre. Final Clinical Impression(s) / ED Diagnoses Final diagnoses:  Abdominal pain, RUQ (right upper quadrant)    Rx / DC Orders ED Discharge Orders    None       Pricilla Loveless, MD 04/16/20 1542

## 2020-04-17 ENCOUNTER — Ambulatory Visit: Payer: Self-pay | Admitting: General Surgery

## 2020-04-26 ENCOUNTER — Encounter (HOSPITAL_BASED_OUTPATIENT_CLINIC_OR_DEPARTMENT_OTHER): Payer: Self-pay | Admitting: General Surgery

## 2020-04-26 ENCOUNTER — Other Ambulatory Visit: Payer: Self-pay

## 2020-04-26 NOTE — Progress Notes (Addendum)
ADDENDUM:  Chart reviewed by anesthesia, Jodell Cipro PA, stated pt will need medical clearance from pcp with stress echo that was ordered by pcp.  Called and spoke w/ Toniann Fail, triage nurse , at Dr Sheliah Hatch office informed of pt needing clearance prior to surgery.   ADDENDUM:  Received call from pt via phone today, stated that she spoke w/ her pcp today and was told that pcp would not give pt clearance for surgery unless is was an emergency. Pt stated she has an appointment with pcp tomorrow 05-01-2020 to be evaluated.   -Spoke w/ via phone for pre-op interview--- PT Lab needs dos---- no              Lab results------ pt getting CBCdiff, CMP done 04-30-2020 @ 1300;  Current ekg in epic/ chart COVID test ------  04-30-2020 @ 1405 Arrive at ------- 0700 NPO after MN NO Solid Food.  Clear liquids from MN until and ensure drink-- 0600 Medications to take morning of surgery ----- Cymbalta, Prilosec, Estradiol  Diabetic medication ----- n/a Patient Special Instructions --- at lab appt will received bag with ensure drink and handout instructions.  Asked pt to bring cpap/ mask/ tubing dos with her Pre-Op special Istructions ----- n/a Patient verbalized understanding of instructions that were given at this phone interview. Patient denies shortness of breath, chest pain, fever, cough at this phone interview.   Anesthesia Review:  Pt has atypical chest pain, stated her pcp is referring her for stress- echo and zio monitor from her last office visit 04-11-2020, note in epic. Advised pt to call her pcp and let her know of scheduled procedure and if ok to proceed.  Pt had ed visit 02-22-2020 w/ atypical chest pain in epic.  And ed visit 04-16-2020 with abdominal pain.  Chart to be reviewed by anesthesia, Jodell Cipro PA.  PCP:  Dr Evelena Peat (lov 04-11-2020 epic)) Cardiologist :  Chest x-ray : 02-22-2020 epic EKG : 04-16-2018 epic Echo : no Event monitor:  12-31-2014 [facility] Stress test:  no Cardiac Cath :  no Activity level:  Gets sob going up stairs Sleep Study/ CPAP : YES/ YES Fasting Blood Sugar :      / Checks Blood Sugar -- times a day:   N/A Blood Thinner/ Instructions /Last Dose: NO ASA / Instructions/ Last Dose :  NO

## 2020-04-30 ENCOUNTER — Other Ambulatory Visit (HOSPITAL_COMMUNITY)
Admission: RE | Admit: 2020-04-30 | Discharge: 2020-04-30 | Disposition: A | Discharge status: Home / Self Care | Payer: BLUE CROSS/BLUE SHIELD | Source: Ambulatory Visit | Attending: General Surgery | Admitting: General Surgery

## 2020-04-30 ENCOUNTER — Other Ambulatory Visit (HOSPITAL_COMMUNITY): Payer: BLUE CROSS/BLUE SHIELD

## 2020-04-30 ENCOUNTER — Other Ambulatory Visit: Payer: Self-pay

## 2020-04-30 ENCOUNTER — Encounter (HOSPITAL_COMMUNITY)
Admission: RE | Admit: 2020-04-30 | Discharge: 2020-04-30 | Disposition: A | Discharge status: Home / Self Care | Payer: BLUE CROSS/BLUE SHIELD | Source: Ambulatory Visit | Attending: General Surgery | Admitting: General Surgery

## 2020-04-30 DIAGNOSIS — Z20822 Contact with and (suspected) exposure to covid-19: Secondary | ICD-10-CM | POA: Diagnosis not present

## 2020-04-30 DIAGNOSIS — Z01812 Encounter for preprocedural laboratory examination: Secondary | ICD-10-CM | POA: Insufficient documentation

## 2020-04-30 LAB — CBC WITH DIFFERENTIAL/PLATELET
Abs Immature Granulocytes: 0.04 10*3/uL (ref 0.00–0.07)
Basophils Absolute: 0.1 10*3/uL (ref 0.0–0.1)
Basophils Relative: 1 %
Eosinophils Absolute: 0 10*3/uL (ref 0.0–0.5)
Eosinophils Relative: 1 %
HCT: 42.4 % (ref 36.0–46.0)
Hemoglobin: 13.8 g/dL (ref 12.0–15.0)
Immature Granulocytes: 1 %
Lymphocytes Relative: 32 %
Lymphs Abs: 2.4 10*3/uL (ref 0.7–4.0)
MCH: 30.1 pg (ref 26.0–34.0)
MCHC: 32.5 g/dL (ref 30.0–36.0)
MCV: 92.4 fL (ref 80.0–100.0)
Monocytes Absolute: 0.6 10*3/uL (ref 0.1–1.0)
Monocytes Relative: 8 %
Neutro Abs: 4.5 10*3/uL (ref 1.7–7.7)
Neutrophils Relative %: 57 %
Platelets: 380 10*3/uL (ref 150–400)
RBC: 4.59 MIL/uL (ref 3.87–5.11)
RDW: 12.8 % (ref 11.5–15.5)
WBC: 7.6 10*3/uL (ref 4.0–10.5)
nRBC: 0 % (ref 0.0–0.2)

## 2020-04-30 LAB — COMPREHENSIVE METABOLIC PANEL
ALT: 23 U/L (ref 0–44)
AST: 18 U/L (ref 15–41)
Albumin: 3.9 g/dL (ref 3.5–5.0)
Alkaline Phosphatase: 64 U/L (ref 38–126)
Anion gap: 8 (ref 5–15)
BUN: 12 mg/dL (ref 8–23)
CO2: 25 mmol/L (ref 22–32)
Calcium: 9.2 mg/dL (ref 8.9–10.3)
Chloride: 106 mmol/L (ref 98–111)
Creatinine, Ser: 0.78 mg/dL (ref 0.44–1.00)
GFR calc Af Amer: >60 mL/min (ref >60)
GFR calc non Af Amer: >60 mL/min (ref >60)
Glucose, Bld: 94 mg/dL (ref 70–99)
Potassium: 4.1 mmol/L (ref 3.5–5.1)
Sodium: 139 mmol/L (ref 135–145)
Total Bilirubin: 0.5 mg/dL (ref 0.3–1.2)
Total Protein: 7.4 g/dL (ref 6.5–8.1)

## 2020-04-30 LAB — SARS CORONAVIRUS 2 (TAT 6-24 HRS): SARS Coronavirus 2: NEGATIVE

## 2020-05-02 ENCOUNTER — Ambulatory Visit (HOSPITAL_BASED_OUTPATIENT_CLINIC_OR_DEPARTMENT_OTHER)
Admission: RE | Admit: 2020-05-02 | Payer: BLUE CROSS/BLUE SHIELD | Source: Home / Self Care | Admitting: General Surgery

## 2020-05-02 HISTORY — DX: Personal history of other venous thrombosis and embolism: Z86.718

## 2020-05-02 HISTORY — DX: Other chest pain: R07.89

## 2020-05-02 HISTORY — DX: Irritable bowel syndrome with constipation: K58.1

## 2020-05-02 HISTORY — DX: Calculus of gallbladder without cholecystitis without obstruction: K80.20

## 2020-05-02 HISTORY — DX: Complete loss of teeth, unspecified cause, unspecified class: K08.109

## 2020-05-02 HISTORY — DX: Gastro-esophageal reflux disease without esophagitis: K21.9

## 2020-05-02 HISTORY — DX: Presence of dental prosthetic device (complete) (partial): Z97.2

## 2020-05-02 SURGERY — LAPAROSCOPIC CHOLECYSTECTOMY WITH INTRAOPERATIVE CHOLANGIOGRAM
Anesthesia: General

## 2020-05-27 ENCOUNTER — Other Ambulatory Visit (HOSPITAL_COMMUNITY)
Admission: RE | Admit: 2020-05-27 | Discharge: 2020-05-27 | Disposition: A | Discharge status: Home / Self Care | Payer: BLUE CROSS/BLUE SHIELD | Source: Ambulatory Visit | Attending: General Surgery | Admitting: General Surgery

## 2020-05-27 DIAGNOSIS — Z20822 Contact with and (suspected) exposure to covid-19: Secondary | ICD-10-CM | POA: Diagnosis not present

## 2020-05-27 DIAGNOSIS — Z01812 Encounter for preprocedural laboratory examination: Secondary | ICD-10-CM | POA: Diagnosis not present

## 2020-05-27 LAB — SARS CORONAVIRUS 2 (TAT 6-24 HRS): SARS Coronavirus 2: NEGATIVE

## 2020-05-27 NOTE — Patient Instructions (Signed)
DUE TO COVID-19 ONLY ONE VISITOR IS ALLOWED TO COME WITH YOU AND STAY IN THE WAITING ROOM ONLY DURING PRE OP AND PROCEDURE DAY OF SURGERY. THE 1 VISITOR  MAY VISIT WITH YOU AFTER SURGERY IN YOUR PRIVATE ROOM DURING VISITING HOURS ONLY!  YOU NEED TO HAVE A COVID 19 TEST ON: 05/27/20, THIS TEST MUST BE DONE BEFORE SURGERY,  COVID TESTING SITE [address] [place] [address], IT IS ON THE RIGHT GOING OUT [address] APPROXIMATELY  2 MINUTES PAST [employer] ON THE RIGHT. ONCE YOUR COVID TEST IS COMPLETED,  PLEASE BEGIN THE QUARANTINE INSTRUCTIONS AS OUTLINED IN YOUR HANDOUT.                Elizabeth Conner   Your procedure is scheduled on: 05/30/20   Report to [facility] Main  Entrance   Report to admitting at: 12:30 PM     Call this number if you have problems the morning of surgery [phone]    Remember:   NO SOLID FOOD AFTER MIDNIGHT THE NIGHT PRIOR TO SURGERY. NOTHING BY MOUTH EXCEPT CLEAR LIQUIDS UNTIL: 11:30 AM . PLEASE FINISH ENSURE DRINK PER SURGEON ORDER  WHICH NEEDS TO BE COMPLETED AT: 11:30 AM .  CLEAR LIQUID DIET   Foods Allowed                                                                     Foods Excluded  Coffee and tea, regular and decaf                             liquids that you cannot  Plain Jell-O any favor except red or purple                                           see through such as: Fruit ices (not with fruit pulp)                                     milk, soups, orange juice  Iced Popsicles                                    All solid food Carbonated beverages, regular and diet                                    Cranberry, grape and apple juices Sports drinks like Gatorade Lightly seasoned clear broth or consume(fat free) Sugar, honey syrup  Sample Menu Breakfast                                Lunch  Supper Cranberry juice                    Beef broth                             Chicken broth Jell-O                                     Grape juice                           Apple juice Coffee or tea                        Jell-O                                      Popsicle                                                Coffee or tea                        Coffee or tea  _____________________________________________________________________   BRUSH YOUR TEETH MORNING OF SURGERY AND RINSE YOUR MOUTH OUT, NO CHEWING GUM CANDY OR MINTS.     Take these medicines the morning of surgery with A SIP OF WATER: Cymbalta,estradiol.                                You may not have any metal on your body including hair pins and              piercings  Do not wear jewelry, make-up, lotions, powders or perfumes, deodorant             Do not wear nail polish on your fingernails.  Do not shave  48 hours prior to surgery.    Do not bring valuables to the hospital. [facility] IS NOT             RESPONSIBLE   FOR VALUABLES.  Contacts, dentures or bridgework may not be worn into surgery.  Leave suitcase in the car. After surgery it may be brought to your room.     Patients discharged the day of surgery will not be allowed to drive home. IF YOU ARE HAVING SURGERY AND GOING HOME THE SAME DAY, YOU MUST HAVE AN ADULT TO DRIVE YOU HOME AND BE WITH YOU FOR 24 HOURS. YOU MAY GO HOME BY TAXI OR UBER OR ORTHERWISE, BUT AN ADULT MUST ACCOMPANY YOU HOME AND STAY WITH YOU FOR 24 HOURS.  Name and phone number of your driver:  Special Instructions: N/A              Please read over the following fact sheets you were given: _____________________________________________________________________         [facility] - Preparing for Surgery Before surgery, you can play an important role.  Because skin is not sterile, your skin needs to be as free of germs as possible.  You can reduce the number of germs on your skin by washing with CHG (chlorahexidine gluconate) soap before surgery.  CHG is an  antiseptic cleaner which kills germs and bonds with the skin to continue killing germs even after washing. Please DO NOT use if you have an allergy to CHG or antibacterial soaps.  If your skin becomes reddened/irritated stop using the CHG and inform your nurse when you arrive at Short Stay. Do not shave (including legs and underarms) for at least 48 hours prior to the first CHG shower.  You may shave your face/neck. Please follow these instructions carefully:  1.  Shower with CHG Soap the night before surgery and the  morning of Surgery.  2.  If you choose to wash your hair, wash your hair first as usual with your  normal  shampoo.  3.  After you shampoo, rinse your hair and body thoroughly to remove the  shampoo.                           4.  Use CHG as you would any other liquid soap.  You can apply chg directly  to the skin and wash                       Gently with a scrungie or clean washcloth.  5.  Apply the CHG Soap to your body ONLY FROM THE NECK DOWN.   Do not use on face/ open                           Wound or open sores. Avoid contact with eyes, ears mouth and genitals (private parts).                       Wash face,  Genitals (private parts) with your normal soap.             6.  Wash thoroughly, paying special attention to the area where your surgery  will be performed.  7.  Thoroughly rinse your body with warm water from the neck down.  8.  DO NOT shower/wash with your normal soap after using and rinsing off  the CHG Soap.                9.  Pat yourself dry with a clean towel.            10.  Wear clean pajamas.            11.  Place clean sheets on your bed the night of your first shower and do not  sleep with pets. Day of Surgery : Do not apply any lotions/deodorants the morning of surgery.  Please wear clean clothes to the hospital/surgery center.  FAILURE TO FOLLOW THESE INSTRUCTIONS MAY RESULT IN THE CANCELLATION OF YOUR SURGERY PATIENT  SIGNATURE_________________________________  NURSE SIGNATURE__________________________________  ________________________________________________________________________

## 2020-05-28 ENCOUNTER — Encounter (HOSPITAL_COMMUNITY)
Admission: RE | Admit: 2020-05-28 | Discharge: 2020-05-28 | Disposition: A | Discharge status: Home / Self Care | Payer: BLUE CROSS/BLUE SHIELD | Source: Ambulatory Visit | Attending: General Surgery | Admitting: General Surgery

## 2020-05-28 ENCOUNTER — Other Ambulatory Visit: Payer: Self-pay

## 2020-05-28 ENCOUNTER — Encounter (HOSPITAL_COMMUNITY): Payer: Self-pay

## 2020-05-28 DIAGNOSIS — Z8711 Personal history of peptic ulcer disease: Secondary | ICD-10-CM | POA: Diagnosis not present

## 2020-05-28 DIAGNOSIS — M19071 Primary osteoarthritis, right ankle and foot: Secondary | ICD-10-CM | POA: Diagnosis not present

## 2020-05-28 DIAGNOSIS — F419 Anxiety disorder, unspecified: Secondary | ICD-10-CM | POA: Diagnosis not present

## 2020-05-28 DIAGNOSIS — Z01812 Encounter for preprocedural laboratory examination: Secondary | ICD-10-CM | POA: Diagnosis not present

## 2020-05-28 DIAGNOSIS — Z86718 Personal history of other venous thrombosis and embolism: Secondary | ICD-10-CM | POA: Diagnosis not present

## 2020-05-28 DIAGNOSIS — M19072 Primary osteoarthritis, left ankle and foot: Secondary | ICD-10-CM | POA: Diagnosis not present

## 2020-05-28 DIAGNOSIS — M19041 Primary osteoarthritis, right hand: Secondary | ICD-10-CM | POA: Diagnosis not present

## 2020-05-28 DIAGNOSIS — M19042 Primary osteoarthritis, left hand: Secondary | ICD-10-CM | POA: Diagnosis not present

## 2020-05-28 DIAGNOSIS — K8010 Calculus of gallbladder with chronic cholecystitis without obstruction: Secondary | ICD-10-CM | POA: Diagnosis not present

## 2020-05-28 DIAGNOSIS — M797 Fibromyalgia: Secondary | ICD-10-CM | POA: Diagnosis not present

## 2020-05-28 DIAGNOSIS — M170 Bilateral primary osteoarthritis of knee: Secondary | ICD-10-CM | POA: Diagnosis not present

## 2020-05-28 DIAGNOSIS — Z79899 Other long term (current) drug therapy: Secondary | ICD-10-CM | POA: Diagnosis not present

## 2020-05-28 DIAGNOSIS — G4733 Obstructive sleep apnea (adult) (pediatric): Secondary | ICD-10-CM | POA: Diagnosis not present

## 2020-05-28 HISTORY — DX: Anxiety disorder, unspecified: F41.9

## 2020-05-28 HISTORY — DX: Angina pectoris, unspecified: I20.9

## 2020-05-28 HISTORY — DX: Cardiac arrhythmia, unspecified: I49.9

## 2020-05-28 LAB — CBC
HCT: 44 % (ref 36.0–46.0)
Hemoglobin: 14.4 g/dL (ref 12.0–15.0)
MCH: 29.9 pg (ref 26.0–34.0)
MCHC: 32.7 g/dL (ref 30.0–36.0)
MCV: 91.3 fL (ref 80.0–100.0)
Platelets: 373 10*3/uL (ref 150–400)
RBC: 4.82 MIL/uL (ref 3.87–5.11)
RDW: 12.6 % (ref 11.5–15.5)
WBC: 7.4 10*3/uL (ref 4.0–10.5)
nRBC: 0 % (ref 0.0–0.2)

## 2020-05-28 LAB — BASIC METABOLIC PANEL
Anion gap: 10 (ref 5–15)
BUN: 15 mg/dL (ref 8–23)
CO2: 27 mmol/L (ref 22–32)
Calcium: 9.6 mg/dL (ref 8.9–10.3)
Chloride: 102 mmol/L (ref 98–111)
Creatinine, Ser: 0.75 mg/dL (ref 0.44–1.00)
GFR calc non Af Amer: >60 mL/min (ref >60)
Glucose, Bld: 97 mg/dL (ref 70–99)
Potassium: 4.4 mmol/L (ref 3.5–5.1)
Sodium: 139 mmol/L (ref 135–145)

## 2020-05-28 NOTE — Progress Notes (Signed)
COVID Vaccine Completed: Yes Date COVID Vaccine completed: 10/2019 COVID vaccine manufacturer:    [employer]   PCP - Dr. Evelena Peat: clearance: 05/24/20 EPIC Cardiologist - Dr. Dorris Carnes  Chest x-ray - 02/22/20 EPIC EKG - 04/16/20 EPIC Stress Test -  ECHO -  Cardiac Cath -  Pacemaker/ICD device last checked:  Sleep Study - Yes CPAP - No  Fasting Blood Sugar -  Checks Blood Sugar _____ times a day  Blood Thinner Instructions: Aspirin Instructions: Last Dose:  Anesthesia review: Hx: chest pain,tachycardia,OSA (NO CPAP)  Patient denies shortness of breath, fever, cough and chest pain at PAT appointment   Patient verbalized understanding of instructions that were given to them at the PAT appointment. Patient was also instructed that they will need to review over the PAT instructions again at home before surgery.

## 2020-05-29 NOTE — Anesthesia Preprocedure Evaluation (Addendum)
Anesthesia Evaluation  Patient identified by MRN, date of birth, ID band Patient awake    Reviewed: Allergy & Precautions, NPO status , Patient's Chart, lab work & pertinent test results  Airway Mallampati: II  TM Distance: >3 FB Neck ROM: Full    Dental  (+) Poor Dentition,    Pulmonary sleep apnea and Continuous Positive Airway Pressure Ventilation ,    Pulmonary exam normal breath sounds clear to auscultation       Cardiovascular Exercise Tolerance: Good Normal cardiovascular exam(-) Valvular Problems/Murmurs Rhythm:Regular Rate:Normal  Pt seen by cardiology 05/10/20 for evaluation of chest pain.  Stress test negative.  Per cardiology, "As of her last visit on 05/10/2020 she was stable for the planned procedure.  No further testing is required at this time assuming she has been clinically stable since that time."   Neuro/Psych PSYCHIATRIC DISORDERS Anxiety    GI/Hepatic PUD, GERD  ,Chronic cholelithiasis   Endo/Other  negative endocrine ROS  Renal/GU negative Renal ROS  negative genitourinary   Musculoskeletal  (+) Arthritis , Fibromyalgia -  Abdominal Normal abdominal exam  (+)   Peds negative pediatric ROS (+)  Hematology negative hematology ROS (+)   Anesthesia Other Findings   Reproductive/Obstetrics                            Anesthesia Physical Anesthesia Plan  ASA: II  Anesthesia Plan: General   Post-op Pain Management:    Induction: Intravenous  PONV Risk Score and Plan: 3 and Midazolam, Dexamethasone, Ondansetron and Treatment may vary due to age or medical condition  Airway Management Planned: Oral ETT  Additional Equipment: None  Intra-op Plan:   Post-operative Plan: Extubation in OR  Informed Consent: I have reviewed the patients History and Physical, chart, labs and discussed the procedure including the risks, benefits and alternatives for the proposed anesthesia  with the patient or authorized representative who has indicated his/her understanding and acceptance.     Dental advisory given  Plan Discussed with: CRNA and Anesthesiologist  Anesthesia Plan Comments:       Anesthesia Quick Evaluation

## 2020-05-29 NOTE — Progress Notes (Signed)
Anesthesia Chart Review   Case: [id number] Date/Time: 05/30/20 1415   Procedure: LAPAROSCOPIC CHOLECYSTECTOMY WITH INTRAOPERATIVE CHOLANGIOGRAM (N/A )   Anesthesia type: General   Pre-op diagnosis: chronic calculus cholecystitis   Location: [facility] ROOM 01 / [facility]   Surgeons: Kinsinger, De Blanch, MD      DISCUSSION:64 y.o. never smoker with h/o GERD, OSA on CPAP, DVT, chronic calculus cholecystitis scheduled for above procedure 05/30/20 with Dr. Feliciana Rossetti.   Pt seen by cardiology 05/10/20 for evaluation of chest pain.  Stress test negative.  Per cardiology, "As of her last visit on 05/10/2020 she was stable for the planned procedure.  No further testing is required at this time assuming she has been clinically stable since that time."  Anticipate pt can proceed with planned procedure barring acute status change.    VS: BP 133/79   Pulse (!) 105   Temp 36.7 C (Oral)   Ht 5\' 2"  (1.575 m)   Wt 74.8 kg   BMI 30.18 kg/m   PROVIDERS: , DO is PCP   Yolanda Manges, MD is Cardiologist  LABS: Labs reviewed: Acceptable for surgery. (all labs ordered are listed, but only abnormal results are displayed)  Labs Reviewed  CBC  BASIC METABOLIC PANEL     IMAGES:   EKG: 04/16/20 Rate 107 bpm Sinus tachycardia  No acute changes  CV: Stress Echo 05/07/20 SUMMARY  normal LV function  negative for ischemia.  Normal left ventricular function and global wall motion with stress.   Past Medical History:  Diagnosis Date  . Anginal pain (HCC)   . Anxiety   . Atypical chest pain followed by pcp--- 04-26-2020  pt stated that her pcp has referred to cardology to get stress test and event monitor at her lov 04-11-2020   04-26-2020 per pt she has had chest discomfort toward the left for yrs, states has not work-up done before, stated tends to happen when she walks alot discomfort goes away after sitting down for 30 min  . Dysphagia   . Dysrhythmia    Tachycardia  . Erosive  gastritis   . Fibromyalgia   . Full dentures   . Gallstones   . GERD (gastroesophageal reflux disease)   . History of DVT of lower extremity    04-26-2020  per pt right extremity dvt post op hysterectomy 1998 and post op TKA, stated treated with blood thinner;  stated this the only times she had clots  . Hyperlipemia   . Insomnia   . Irritable bowel syndrome with constipation   . OSA on CPAP    04-26-2020  per pt uses every night  . Osteoarthritis    hands, knees, feet  . Tachycardia    04-26-2020----per pt has had tachycardia for years, had never taken medication  . Tympanosclerosis     Past Surgical History:  Procedure Laterality Date  . arm fracture Right    broken radious and ulnar  . CESAREAN SECTION  x2  last one 1980  . TOTAL KNEE ARTHROPLASTY Right 2015  . TOTAL VAGINAL HYSTERECTOMY  1998   w/  BILATERAL SALPINGOOPHORECTOMY    MEDICATIONS: . amitriptyline (ELAVIL) 50 MG tablet  . cholecalciferol (VITAMIN D) 25 MCG (1000 UNIT) tablet  . cyanocobalamin 1000 MCG tablet  . diclofenac Sodium (VOLTAREN) 1 % GEL  . DULoxetine (CYMBALTA) 30 MG capsule  . DULoxetine (CYMBALTA) 60 MG capsule  . estradiol (ESTRACE) 1 MG tablet  . GREEN COFFEE BEAN PO  . HYDROcodone-acetaminophen (NORCO/VICODIN)  5-325 MG tablet  . L-Lysine 500 MG TABS  . magnesium oxide (MAG-OX) 400 MG tablet  . ondansetron (ZOFRAN ODT) 4 MG disintegrating tablet  . vitamin B-12 (CYANOCOBALAMIN) 1000 MCG tablet  . Zinc 50 MG TABS   No current facility-administered medications for this encounter.    Jodell Cipro, PA-C [facility] Pre-Surgical Testing [phone]

## 2020-05-30 ENCOUNTER — Ambulatory Visit (HOSPITAL_COMMUNITY): Payer: BLUE CROSS/BLUE SHIELD | Admitting: Physician Assistant

## 2020-05-30 ENCOUNTER — Ambulatory Visit (HOSPITAL_COMMUNITY): Payer: BLUE CROSS/BLUE SHIELD

## 2020-05-30 ENCOUNTER — Encounter (HOSPITAL_COMMUNITY)
Admission: RE | Disposition: A | Discharge status: Home / Self Care | Payer: Self-pay | Source: Home / Self Care | Attending: General Surgery

## 2020-05-30 ENCOUNTER — Ambulatory Visit (HOSPITAL_COMMUNITY): Payer: BLUE CROSS/BLUE SHIELD | Admitting: Anesthesiology

## 2020-05-30 ENCOUNTER — Telehealth (HOSPITAL_COMMUNITY): Payer: Self-pay | Admitting: *Deleted

## 2020-05-30 ENCOUNTER — Encounter (HOSPITAL_COMMUNITY): Payer: Self-pay | Admitting: General Surgery

## 2020-05-30 ENCOUNTER — Ambulatory Visit (HOSPITAL_COMMUNITY)
Admission: RE | Admit: 2020-05-30 | Discharge: 2020-05-30 | Disposition: A | Discharge status: Home / Self Care | Payer: BLUE CROSS/BLUE SHIELD | Attending: General Surgery | Admitting: General Surgery

## 2020-05-30 DIAGNOSIS — Z8711 Personal history of peptic ulcer disease: Secondary | ICD-10-CM | POA: Diagnosis not present

## 2020-05-30 DIAGNOSIS — M170 Bilateral primary osteoarthritis of knee: Secondary | ICD-10-CM | POA: Insufficient documentation

## 2020-05-30 DIAGNOSIS — K8010 Calculus of gallbladder with chronic cholecystitis without obstruction: Secondary | ICD-10-CM | POA: Insufficient documentation

## 2020-05-30 DIAGNOSIS — M19041 Primary osteoarthritis, right hand: Secondary | ICD-10-CM | POA: Diagnosis not present

## 2020-05-30 DIAGNOSIS — Z419 Encounter for procedure for purposes other than remedying health state, unspecified: Secondary | ICD-10-CM

## 2020-05-30 DIAGNOSIS — M19042 Primary osteoarthritis, left hand: Secondary | ICD-10-CM | POA: Diagnosis not present

## 2020-05-30 DIAGNOSIS — M19071 Primary osteoarthritis, right ankle and foot: Secondary | ICD-10-CM | POA: Diagnosis not present

## 2020-05-30 DIAGNOSIS — F419 Anxiety disorder, unspecified: Secondary | ICD-10-CM | POA: Insufficient documentation

## 2020-05-30 DIAGNOSIS — M797 Fibromyalgia: Secondary | ICD-10-CM | POA: Diagnosis not present

## 2020-05-30 DIAGNOSIS — G4733 Obstructive sleep apnea (adult) (pediatric): Secondary | ICD-10-CM | POA: Insufficient documentation

## 2020-05-30 DIAGNOSIS — M19072 Primary osteoarthritis, left ankle and foot: Secondary | ICD-10-CM | POA: Insufficient documentation

## 2020-05-30 DIAGNOSIS — Z79899 Other long term (current) drug therapy: Secondary | ICD-10-CM | POA: Diagnosis not present

## 2020-05-30 DIAGNOSIS — Z86718 Personal history of other venous thrombosis and embolism: Secondary | ICD-10-CM | POA: Diagnosis not present

## 2020-05-30 HISTORY — PX: CHOLECYSTECTOMY: SHX55

## 2020-05-30 SURGERY — LAPAROSCOPIC CHOLECYSTECTOMY WITH INTRAOPERATIVE CHOLANGIOGRAM
Anesthesia: General | Site: Abdomen

## 2020-05-30 MED ORDER — OXYCODONE HCL 5 MG PO TABS
ORAL_TABLET | ORAL | Status: AC
  Filled 2020-05-30: qty 1

## 2020-05-30 MED ORDER — OXYCODONE HCL 5 MG/5ML PO SOLN: 5.0000 mg | Freq: Once | ORAL | Status: AC | PRN

## 2020-05-30 MED ORDER — PROMETHAZINE HCL 25 MG/ML IJ SOLN: 6.2500 mg | INTRAMUSCULAR | Status: DC | PRN

## 2020-05-30 MED ORDER — LACTATED RINGERS IR SOLN
Status: DC | PRN
  Administered 2020-05-30: 1000 mL

## 2020-05-30 MED ORDER — FENTANYL CITRATE (PF) 250 MCG/5ML IJ SOLN
INTRAMUSCULAR | Status: DC | PRN
Start: 2020-05-30 — End: 2020-05-30
  Administered 2020-05-30 (×5): 50 ug via INTRAVENOUS

## 2020-05-30 MED ORDER — ONDANSETRON HCL 4 MG/2ML IJ SOLN
INTRAMUSCULAR | Status: AC
  Filled 2020-05-30: qty 2

## 2020-05-30 MED ORDER — IOHEXOL 300 MG/ML  SOLN
INTRAMUSCULAR | Status: DC | PRN
  Administered 2020-05-30: 6.5 mL

## 2020-05-30 MED ORDER — 0.9 % SODIUM CHLORIDE (POUR BTL) OPTIME
TOPICAL | Status: DC | PRN
  Administered 2020-05-30: 1000 mL

## 2020-05-30 MED ORDER — PROPOFOL 10 MG/ML IV BOLUS
INTRAVENOUS | Status: DC | PRN
  Administered 2020-05-30: 150 mg via INTRAVENOUS

## 2020-05-30 MED ORDER — CEFAZOLIN SODIUM-DEXTROSE 2-4 GM/100ML-% IV SOLN
2.0000 g | INTRAVENOUS | Status: AC
  Administered 2020-05-30: 2 g via INTRAVENOUS
  Filled 2020-05-30: qty 100

## 2020-05-30 MED ORDER — DEXAMETHASONE SODIUM PHOSPHATE 10 MG/ML IJ SOLN
INTRAMUSCULAR | Status: DC | PRN
  Administered 2020-05-30: 10 mg via INTRAVENOUS

## 2020-05-30 MED ORDER — IBUPROFEN 800 MG PO TABS: 800.0000 mg | ORAL_TABLET | Freq: Three times a day (TID) | ORAL | 0 refills | Status: AC | PRN

## 2020-05-30 MED ORDER — PROPOFOL 10 MG/ML IV BOLUS
INTRAVENOUS | Status: AC
  Filled 2020-05-30: qty 20

## 2020-05-30 MED ORDER — KETOROLAC TROMETHAMINE 15 MG/ML IJ SOLN
15.0000 mg | INTRAMUSCULAR | Status: AC
  Administered 2020-05-30: 15 mg via INTRAVENOUS
  Filled 2020-05-30: qty 1

## 2020-05-30 MED ORDER — BUPIVACAINE HCL (PF) 0.5 % IJ SOLN
INTRAMUSCULAR | Status: AC
  Filled 2020-05-30: qty 30

## 2020-05-30 MED ORDER — DEXAMETHASONE SODIUM PHOSPHATE 10 MG/ML IJ SOLN
INTRAMUSCULAR | Status: AC
  Filled 2020-05-30: qty 1

## 2020-05-30 MED ORDER — LIDOCAINE 2% (20 MG/ML) 5 ML SYRINGE
INTRAMUSCULAR | Status: AC
  Filled 2020-05-30: qty 5

## 2020-05-30 MED ORDER — OXYCODONE HCL 5 MG PO TABS
5.0000 mg | ORAL_TABLET | Freq: Once | ORAL | Status: AC | PRN
  Administered 2020-05-30: 5 mg via ORAL

## 2020-05-30 MED ORDER — FENTANYL CITRATE (PF) 250 MCG/5ML IJ SOLN
INTRAMUSCULAR | Status: AC
  Filled 2020-05-30: qty 5

## 2020-05-30 MED ORDER — ENSURE PRE-SURGERY PO LIQD
296.0000 mL | Freq: Once | ORAL | Status: DC
  Filled 2020-05-30: qty 296

## 2020-05-30 MED ORDER — BUPIVACAINE HCL 0.5 % IJ SOLN
INTRAMUSCULAR | Status: DC | PRN
  Administered 2020-05-30: 30 mL

## 2020-05-30 MED ORDER — SUGAMMADEX SODIUM 200 MG/2ML IV SOLN
INTRAVENOUS | Status: DC | PRN
  Administered 2020-05-30: 200 mg via INTRAVENOUS

## 2020-05-30 MED ORDER — ENOXAPARIN SODIUM 40 MG/0.4ML ~~LOC~~ SOLN
40.0000 mg | Freq: Once | SUBCUTANEOUS | Status: AC
  Administered 2020-05-30: 40 mg via SUBCUTANEOUS
  Filled 2020-05-30: qty 0.4

## 2020-05-30 MED ORDER — LIDOCAINE 2% (20 MG/ML) 5 ML SYRINGE
INTRAMUSCULAR | Status: DC | PRN
  Administered 2020-05-30: 80 mg via INTRAVENOUS

## 2020-05-30 MED ORDER — FENTANYL CITRATE (PF) 100 MCG/2ML IJ SOLN
INTRAMUSCULAR | Status: AC
  Filled 2020-05-30: qty 2

## 2020-05-30 MED ORDER — FENTANYL CITRATE (PF) 100 MCG/2ML IJ SOLN
25.0000 ug | INTRAMUSCULAR | Status: DC | PRN
  Administered 2020-05-30: 25 ug via INTRAVENOUS

## 2020-05-30 MED ORDER — MIDAZOLAM HCL 2 MG/2ML IJ SOLN
INTRAMUSCULAR | Status: DC | PRN
  Administered 2020-05-30: 2 mg via INTRAVENOUS

## 2020-05-30 MED ORDER — ONDANSETRON 4 MG PO TBDP: 4.0000 mg | ORAL_TABLET | Freq: Four times a day (QID) | ORAL | 0 refills | Status: AC | PRN

## 2020-05-30 MED ORDER — MIDAZOLAM HCL 2 MG/2ML IJ SOLN
INTRAMUSCULAR | Status: AC
  Filled 2020-05-30: qty 2

## 2020-05-30 MED ORDER — CHLORHEXIDINE GLUCONATE CLOTH 2 % EX PADS: 6.0000 | MEDICATED_PAD | Freq: Once | CUTANEOUS | Status: DC

## 2020-05-30 MED ORDER — ACETAMINOPHEN 500 MG PO TABS
1000.0000 mg | ORAL_TABLET | ORAL | Status: AC
  Administered 2020-05-30: 1000 mg via ORAL
  Filled 2020-05-30: qty 2

## 2020-05-30 MED ORDER — LACTATED RINGERS IV SOLN: INTRAVENOUS | Status: DC

## 2020-05-30 MED ORDER — ONDANSETRON HCL 4 MG/2ML IJ SOLN
INTRAMUSCULAR | Status: DC | PRN
  Administered 2020-05-30: 4 mg via INTRAVENOUS

## 2020-05-30 MED ORDER — OXYCODONE HCL 5 MG PO TABS: 5.0000 mg | ORAL_TABLET | Freq: Four times a day (QID) | ORAL | 0 refills | Status: AC | PRN

## 2020-05-30 MED ORDER — ROCURONIUM BROMIDE 10 MG/ML (PF) SYRINGE
PREFILLED_SYRINGE | INTRAVENOUS | Status: AC
  Filled 2020-05-30: qty 10

## 2020-05-30 MED ORDER — ROCURONIUM BROMIDE 10 MG/ML (PF) SYRINGE
PREFILLED_SYRINGE | INTRAVENOUS | Status: DC | PRN
  Administered 2020-05-30: 75 mg via INTRAVENOUS

## 2020-05-30 MED ORDER — ORAL CARE MOUTH RINSE: 15.0000 mL | Freq: Once | OROMUCOSAL | Status: AC

## 2020-05-30 MED ORDER — CHLORHEXIDINE GLUCONATE 0.12 % MT SOLN
15.0000 mL | Freq: Once | OROMUCOSAL | Status: AC
  Administered 2020-05-30: 15 mL via OROMUCOSAL

## 2020-05-30 SURGICAL SUPPLY — 52 items
ADH SKN CLS APL DERMABOND .7 (GAUZE/BANDAGES/DRESSINGS)
APL PRP STRL LF DISP 70% ISPRP (MISCELLANEOUS) ×1
APL SKNCLS STERI-STRIP NONHPOA (GAUZE/BANDAGES/DRESSINGS) ×1
APPLIER CLIP ROT 10 11.4 M/L (STAPLE) ×2
APR CLP MED LRG 11.4X10 (STAPLE) ×1
BAG SPEC RTRVL 10 TROC 200 (ENDOMECHANICALS) ×1
BENZOIN TINCTURE PRP APPL 2/3 (GAUZE/BANDAGES/DRESSINGS) ×2 IMPLANT
BNDG ADH 1X3 SHEER STRL LF (GAUZE/BANDAGES/DRESSINGS) ×8 IMPLANT
BNDG ADH THN 3X1 STRL LF (GAUZE/BANDAGES/DRESSINGS) ×4
CABLE HIGH FREQUENCY MONO STRZ (ELECTRODE) ×2 IMPLANT
CATH CHOLANG 76X19 KUMAR (CATHETERS) ×2 IMPLANT
CHLORAPREP W/TINT 26 (MISCELLANEOUS) ×2 IMPLANT
CLIP APPLIE ROT 10 11.4 M/L (STAPLE) ×1 IMPLANT
CLIP VESOLOCK LG 6/CT PURPLE (CLIP) IMPLANT
CLIP VESOLOCK MED LG 6/CT (CLIP) ×2 IMPLANT
COVER MAYO STAND STRL (DRAPES) ×2 IMPLANT
COVER WAND RF STERILE (DRAPES) ×2 IMPLANT
DECANTER SPIKE VIAL GLASS SM (MISCELLANEOUS) ×2 IMPLANT
DERMABOND ADVANCED (GAUZE/BANDAGES/DRESSINGS)
DERMABOND ADVANCED .7 DNX12 (GAUZE/BANDAGES/DRESSINGS) IMPLANT
DRAIN CHANNEL 19F RND (DRAIN) IMPLANT
DRAPE C-ARM 42X120 X-RAY (DRAPES) ×2 IMPLANT
ELECT REM PT RETURN 15FT ADLT (MISCELLANEOUS) ×2 IMPLANT
EVACUATOR SILICONE 100CC (DRAIN) IMPLANT
GLOVE BIOGEL PI IND STRL 7.0 (GLOVE) ×1 IMPLANT
GLOVE BIOGEL PI INDICATOR 7.0 (GLOVE) ×1
GLOVE SURG SS PI 7.0 STRL IVOR (GLOVE) ×2 IMPLANT
GOWN STRL REUS W/TWL LRG LVL3 (GOWN DISPOSABLE) ×2 IMPLANT
GRASPER SUT TROCAR 14GX15 (MISCELLANEOUS) IMPLANT
HEMOSTAT SNOW SURGICEL 2X4 (HEMOSTASIS) IMPLANT
IRRIG SUCT STRYKERFLOW 2 WTIP (MISCELLANEOUS) ×2
IRRIGATION SUCT STRKRFLW 2 WTP (MISCELLANEOUS) ×1 IMPLANT
IV CATH 14GX2 1/4 (CATHETERS) ×2 IMPLANT
KIT BASIN OR (CUSTOM PROCEDURE TRAY) ×2 IMPLANT
KIT TURNOVER CYSTO (KITS) ×2 IMPLANT
PENCIL SMOKE EVACUATOR (MISCELLANEOUS) IMPLANT
POUCH RETRIEVAL ECOSAC 10 (ENDOMECHANICALS) ×1 IMPLANT
POUCH RETRIEVAL ECOSAC 10MM (ENDOMECHANICALS) ×1
SCISSORS LAP 5X35 DISP (ENDOMECHANICALS) ×2 IMPLANT
SET TUBE SMOKE EVAC HIGH FLOW (TUBING) ×2 IMPLANT
STOPCOCK 4 WAY LG BORE MALE ST (IV SETS) ×2 IMPLANT
STRIP CLOSURE SKIN 1/2X4 (GAUZE/BANDAGES/DRESSINGS) ×2 IMPLANT
SUT ETHILON 2 0 PS N (SUTURE) IMPLANT
SUT MNCRL AB 4-0 PS2 18 (SUTURE) ×4 IMPLANT
SUT VICRYL 0 ENDOLOOP (SUTURE) IMPLANT
SUT VICRYL 0 UR6 27IN ABS (SUTURE) IMPLANT
TOWEL OR 17X26 10 PK STRL BLUE (TOWEL DISPOSABLE) ×2 IMPLANT
TRAY LAPAROSCOPIC (CUSTOM PROCEDURE TRAY) ×2 IMPLANT
TROCAR BLADELESS OPT 12M 100M (ENDOMECHANICALS) IMPLANT
TROCAR BLADELESS OPT 5 100 (ENDOMECHANICALS) ×6 IMPLANT
TROCAR XCEL NON-BLD 11X100MML (ENDOMECHANICALS) ×2 IMPLANT
WARMER LAPAROSCOPE (MISCELLANEOUS) ×2 IMPLANT

## 2020-05-30 NOTE — H&P (Signed)
Elizabeth Conner is an 64 y.o. female.   Chief Complaint: abdominal pain HPI: 64 yo female with intermittent abdominal pain and work up consistent with chronic cholecystitis with stones.  Past Medical History:  Diagnosis Date  . Anginal pain (HCC)   . Anxiety   . Atypical chest pain followed by pcp--- 04-26-2020  pt stated that her pcp has referred to cardology to get stress test and event monitor at her lov 04-11-2020   04-26-2020 per pt she has had chest discomfort toward the left for yrs, states has not work-up done before, stated tends to happen when she walks alot discomfort goes away after sitting down for 30 min  . Dysphagia   . Dysrhythmia    Tachycardia  . Erosive gastritis   . Fibromyalgia   . Full dentures   . Gallstones   . GERD (gastroesophageal reflux disease)   . History of DVT of lower extremity    04-26-2020  per pt right extremity dvt post op hysterectomy 1998 and post op TKA, stated treated with blood thinner;  stated this the only times she had clots  . Hyperlipemia   . Insomnia   . Irritable bowel syndrome with constipation   . OSA on CPAP    04-26-2020  per pt uses every night  . Osteoarthritis    hands, knees, feet  . Tachycardia    04-26-2020----per pt has had tachycardia for years, had never taken medication  . Tympanosclerosis     Past Surgical History:  Procedure Laterality Date  . arm fracture Right    broken radious and ulnar  . CESAREAN SECTION  x2  last one 1980  . TOTAL KNEE ARTHROPLASTY Right 2015  . TOTAL VAGINAL HYSTERECTOMY  1998   w/  BILATERAL SALPINGOOPHORECTOMY    History reviewed. No pertinent family history. Social History:  reports that she has never smoked. She has never used smokeless tobacco. She reports current alcohol use. She reports that she does not use drugs.  Allergies: No Known Allergies  Medications Prior to Admission  Medication Sig Dispense Refill  . amitriptyline (ELAVIL) 50 MG tablet Take 100 mg by mouth at  bedtime.    . cholecalciferol (VITAMIN D) 25 MCG (1000 UNIT) tablet Take 2,000 Units by mouth daily.    . cyanocobalamin 1000 MCG tablet Take 1,500 mcg by mouth daily.     . diclofenac Sodium (VOLTAREN) 1 % GEL Apply 1 application topically 4 (four) times daily as needed (pain.).    Marland Kitchen DULoxetine (CYMBALTA) 30 MG capsule Take 30 mg by mouth at bedtime.    . DULoxetine (CYMBALTA) 60 MG capsule Take 60 mg by mouth daily.     Marland Kitchen estradiol (ESTRACE) 1 MG tablet Take 1 mg by mouth daily.     Marland Kitchen GREEN COFFEE BEAN PO Take 800 mg by mouth in the morning and at bedtime.    Marland Kitchen L-Lysine 500 MG TABS Take 500 mg by mouth daily.    . magnesium oxide (MAG-OX) 400 MG tablet Take 800 mg by mouth daily.    . vitamin B-12 (CYANOCOBALAMIN) 1000 MCG tablet Take 1,500 mcg by mouth daily.    . Zinc 50 MG TABS Take 50 mg by mouth daily.    Marland Kitchen HYDROcodone-acetaminophen (NORCO/VICODIN) 5-325 MG tablet Take 1-2 tablets by mouth every 6 (six) hours as needed for moderate pain. (Patient not taking: Reported on 05/27/2020) 30 tablet 0  . ondansetron (ZOFRAN ODT) 4 MG disintegrating tablet Take 1 tablet (4 mg total) by  mouth every 4 (four) hours as needed for nausea or vomiting. (Patient not taking: Reported on 05/27/2020) 20 tablet 0    Results for orders placed or performed during the hospital encounter of 05/28/20 (from the past 48 hour(s))  CBC per protocol     Status: None   Collection Time: 05/28/20  1:39 PM  Result Value Ref Range   WBC 7.4 4.0 - 10.5 K/uL   RBC 4.82 3.87 - 5.11 MIL/uL   Hemoglobin 14.4 12.0 - 15.0 g/dL   HCT 26.7 36 - 46 %   MCV 91.3 80.0 - 100.0 fL   MCH 29.9 26.0 - 34.0 pg   MCHC 32.7 30.0 - 36.0 g/dL   RDW 12.4 58.0 - 99.8 %   Platelets 373 150 - 400 K/uL   nRBC 0.0 0.0 - 0.2 %    Comment: Performed at [facility], [address], [place], [place] [address]  Basic metabolic panel per protocol     Status: None   Collection Time: 05/28/20  1:39 PM  Result Value Ref Range    Sodium 139 135 - 145 mmol/L   Potassium 4.4 3.5 - 5.1 mmol/L   Chloride 102 98 - 111 mmol/L   CO2 27 22 - 32 mmol/L   Glucose, Bld 97 70 - 99 mg/dL    Comment: Glucose reference range applies only to samples taken after fasting for at least 8 hours.   BUN 15 8 - 23 mg/dL   Creatinine, Ser 0.53 0.44 - 1.00 mg/dL   Calcium 9.6 8.9 - 97.6 mg/dL   GFR calc non Af Amer >60 >60 mL/min   Anion gap 10 5 - 15    Comment: Performed at [facility], [address] [place], [place] [address]   No results found.  Review of Systems  Constitutional: Negative for chills and fever.  HENT: Negative for hearing loss.   Respiratory: Negative for cough.   Cardiovascular: Negative for chest pain and palpitations.  Gastrointestinal: Negative for abdominal pain, nausea and vomiting.  Genitourinary: Negative for dysuria and urgency.  Musculoskeletal: Negative for myalgias and neck pain.  Skin: Negative for rash.  Neurological: Negative for dizziness and headaches.  Hematological: Does not bruise/bleed easily.  Psychiatric/Behavioral: Negative for suicidal ideas.    Blood pressure 127/79, pulse 92, temperature 98.3 F (36.8 C), temperature source Oral, resp. rate 16, height 5\' 2"  (1.575 m), weight 74.8 kg, SpO2 98 %. Physical Exam Vitals and nursing note reviewed.  Constitutional:      Appearance: She is well-developed.  HENT:     Head: Normocephalic and atraumatic.  Eyes:     General: No scleral icterus.    Conjunctiva/sclera: Conjunctivae normal.  Cardiovascular:     Rate and Rhythm: Normal rate and regular rhythm.  Pulmonary:     Effort: Pulmonary effort is normal.     Breath sounds: Normal breath sounds. No wheezing or rales.  Chest:     Chest wall: No tenderness.  Abdominal:     General: There is no distension.     Palpations: Abdomen is soft.     Tenderness: There is no abdominal tenderness. There is no rebound.  Musculoskeletal:        General: Normal range of  motion.     Cervical back: Normal range of motion and neck supple.  Skin:    General: Skin is warm and dry.  Neurological:     Mental Status: She is alert and oriented to person,  place, and time.  Psychiatric:        Behavior: Behavior normal.    Assessment/Plan 64 yo female with intermittent abdominal pain -lap chole with IOC -planned outpatient procedure -[facility] protocol  Rodman Pickle, MD 05/30/2020, 12:59 PM

## 2020-05-30 NOTE — Anesthesia Procedure Notes (Signed)
Procedure Name: Intubation Date/Time: 05/30/2020 1:08 PM Performed by: Florene Route, CRNA Patient Re-evaluated:Patient Re-evaluated prior to induction Oxygen Delivery Method: Circle system utilized Preoxygenation: Pre-oxygenation with 100% oxygen Induction Type: IV induction Ventilation: Mask ventilation without difficulty and Oral airway inserted - appropriate to patient size Laryngoscope Size: Hyacinth Meeker and 2 Grade View: Grade I Tube type: Oral Tube size: 7.5 mm Number of attempts: 1 Airway Equipment and Method: Stylet Placement Confirmation: ETT inserted through vocal cords under direct vision,  positive ETCO2 and breath sounds checked- equal and bilateral Secured at: 21 cm Tube secured with: Tape Dental Injury: Teeth and Oropharynx as per pre-operative assessment

## 2020-05-30 NOTE — Anesthesia Postprocedure Evaluation (Signed)
Anesthesia Post Note  Patient: Elizabeth Conner, Elizabeth Conner  Procedure(s) Performed: LAPAROSCOPIC CHOLECYSTECTOMY WITH INTRAOPERATIVE CHOLANGIOGRAM (N/A Abdomen)     Patient location during evaluation: PACU Anesthesia Type: General Level of consciousness: sedated Pain management: pain level controlled Vital Signs Assessment: post-procedure vital signs reviewed and stable Respiratory status: spontaneous breathing and respiratory function stable Cardiovascular status: stable Postop Assessment: no apparent nausea or vomiting Anesthetic complications: no   No complications documented.  Last Vitals:  Vitals:   05/30/20 1530 05/30/20 1545  BP: 112/72 115/66  Pulse: (!) 113 (!) 112  Resp: 12 13  Temp:  36.5 C  SpO2: 91% 98%    Last Pain:  Vitals:   05/30/20 1545  TempSrc:   PainSc: 5                  Candra R Mateusz Neilan

## 2020-05-30 NOTE — Transfer of Care (Signed)
Immediate Anesthesia Transfer of Care Note  Patient: Elizabeth Conner  Procedure(s) Performed: LAPAROSCOPIC CHOLECYSTECTOMY WITH INTRAOPERATIVE CHOLANGIOGRAM (N/A Abdomen)  Patient Location: PACU  Anesthesia Type:General  Level of Consciousness: drowsy  Airway & Oxygen Therapy: Patient Spontanous Breathing and Patient connected to face mask oxygen  Post-op Assessment: Report given to RN and Post -op Vital signs reviewed and stable  Post vital signs: Reviewed and stable  Last Vitals:  Vitals Value Taken Time  BP 149/77 05/30/20 1436  Temp    Pulse 121 05/30/20 1438  Resp 13 05/30/20 1438  SpO2 96 % 05/30/20 1438  Vitals shown include unvalidated device data.  Last Pain:  Vitals:   05/30/20 1436  TempSrc:   PainSc: (P) Asleep      Patients Stated Pain Goal: 1 (05/30/20 1132)  Complications: No complications documented.

## 2020-05-30 NOTE — Op Note (Signed)
PATIENT:  Elizabeth Conner  64 y.o. female  PRE-OPERATIVE DIAGNOSIS:  chronic calculus cholecystitis  POST-OPERATIVE DIAGNOSIS:  chronic calculus cholecystitis  PROCEDURE:  Procedure(s): LAPAROSCOPIC CHOLECYSTECTOMY WITH INTRAOPERATIVE CHOLANGIOGRAM   SURGEON:  Surgeon(s): Kinsinger, De Blanch, MD  ASSISTANT: Kerin Salen, MD [facility]  ANESTHESIA:   local and general  Indications for procedure: [name] is a 64 y.o. female with symptoms of Abdominal pain consistent with gallbladder disease, Confirmed by Ultrasound.  Description of procedure: The patient was brought into the operative suite, placed supine. Anesthesia was administered with endotracheal tube. Patient was strapped in place and foot board was secured. All pressure points were offloaded by foam padding. The patient was prepped and draped in the usual sterile fashion.  A small incision was made to the right of the umbilicus. A 100mm trocar was inserted into the peritoneal cavity with optical entry. Pneumoperitoneum was applied with high flow low pressure. 2 38mm trocars were placed in the RUQ. A 76mm trocar was placed in the subxiphoid space. Marcaine was infused to the subxiphoid space and lateral upper right abdomen in the transversus abdominis plane. Next the patient was placed in reverse trendelenberg.   The gallbladder was retracted cephalad and lateral. The peritoneum was reflected off the infundibulum working lateral to medial. The cystic duct and cystic artery were identified and further dissection revealed a critical view, due to concern for choledocholithiasis a cholangiogram was performed with ductotomy and cook catheter passed through a separate subcostal stab incision. There were no filling defects, contrast emptied into the duodenum, and the entire external biliary tree was visualized. The cystic duct and cystic artery were doubly clipped and ligated.   The gallbladder was removed off the liver bed with cautery.  The Gallbladder was placed in a specimen bag. The gallbladder fossa was irrigated and hemostasis was applied with cautery. The gallbladder was removed via the 60mm trocar. No dilation was required for removal, therefore no fascial closure was performed. Pneumoperitoneum was removed, all trocar were removed. All incisions were closed with 4-0 monocryl subcuticular stitch. The patient woke from anesthesia and was brought to PACU in stable condition. All counts were correct  Findings: Cholelithiasis, normal duct and anatomy  Specimen: gallbladder  Blood loss: 50 ml  Local anesthesia: 30 ml marcaine  Complications: none  PLAN OF CARE: Discharge to home after PACU  PATIENT DISPOSITION:  PACU - hemodynamically stable.  Images:   Feliciana Rossetti, M.D. General, Bariatric, & Minimally Invasive Surgery [facility]

## 2020-05-30 NOTE — Discharge Instructions (Signed)
[facility] ______[facility] °LAPAROSCOPIC SURGERY: POST OP INSTRUCTIONS °Always review your discharge instruction sheet given to you by the facility where your surgery was performed. °IF YOU HAVE DISABILITY OR FAMILY LEAVE FORMS, YOU MUST BRING THEM TO THE OFFICE FOR PROCESSING.   °DO NOT GIVE THEM TO YOUR DOCTOR. ° °1. A prescription for pain medication may be given to you upon discharge.  Take your pain medication as prescribed, if needed.  If narcotic pain medicine is not needed, then you may take acetaminophen (Tylenol) or ibuprofen (Advil) as needed. °2. Take your usually prescribed medications unless otherwise directed. °3. If you need a refill on your pain medication, please contact your pharmacy.  They will contact our office to request authorization. Prescriptions will not be filled after 5pm or on week-ends. °4. You should follow a light diet the first few days after arrival home, such as soup and crackers, etc.  Be sure to include lots of fluids daily. °5. Most patients will experience some swelling and bruising in the area of the incisions.  Ice packs will help.  Swelling and bruising can take several days to resolve.  °6. It is common to experience some constipation if taking pain medication after surgery.  Increasing fluid intake and taking a stool softener (such as Colace) will usually help or prevent this problem from occurring.  A mild laxative (Milk of Magnesia or Miralax) should be taken according to package instructions if there are no bowel movements after 48 hours. °7. Unless discharge instructions indicate otherwise, you may remove your bandages 24-48 hours after surgery, and you may shower at that time.  You may have steri-strips (small skin tapes) in place directly over the incision.  These strips should be left on the skin for 7-10 days.  If your surgeon used skin glue on the incision, you may shower in 24 hours.  The glue will flake off over the next 2-3 weeks.  Any sutures or  staples will be removed at the office during your follow-up visit. °8. ACTIVITIES:  You may resume regular (light) daily activities beginning the next day--such as daily self-care, walking, climbing stairs--gradually increasing activities as tolerated.  You may have sexual intercourse when it is comfortable.  Refrain from any heavy lifting or straining until approved by your doctor. °a. You may drive when you are no longer taking prescription pain medication, you can comfortably wear a seatbelt, and you can safely maneuver your car and apply brakes. °b. RETURN TO WORK:  __________________________________________________________ °9. You should see your doctor in the office for a follow-up appointment approximately 2-3 weeks after your surgery.  Make sure that you call for this appointment within a day or two after you arrive home to insure a convenient appointment time. °10. OTHER INSTRUCTIONS: __________________________________________________________________________________________________________________________ __________________________________________________________________________________________________________________________ °WHEN TO CALL YOUR DOCTOR: °1. Fever over 101.0 °2. Inability to urinate °3. Continued bleeding from incision. °4. Increased pain, redness, or drainage from the incision. °5. Increasing abdominal pain ° °The clinic staff is available to answer your questions during regular business hours.  Please don’t hesitate to call and ask to speak to one of the nurses for clinical concerns.  If you have a medical emergency, go to the nearest emergency room or call 911.  A surgeon from [facility] is always on call at the hospital. °[address], [address], [place], [place]  [address] ? [address] [address], [place]   [address] °[phone] ? [phone] ? FAX [phone] °Web site:   [email or web address] °

## 2020-06-03 ENCOUNTER — Encounter (HOSPITAL_COMMUNITY): Payer: Self-pay | Admitting: General Surgery

## 2020-06-03 LAB — SURGICAL PATHOLOGY

## 2021-06-10 IMAGING — CT CT HEAD W/O CM
3 series · 15 of 47 positions shown, 18 images · non-contrast
Comparison: None.

CLINICAL DATA: Dizziness

EXAM:
CT HEAD WITHOUT CONTRAST
TECHNIQUE: Contiguous axial images were obtained from the base of the skull
through the vertex without intravenous contrast.

[Series 2: head wo · axial · 0.40mm/px · z∈[+1094,+1218]mm · 9 of 30 slices shown, 12 images]
[im 3/30  brain]
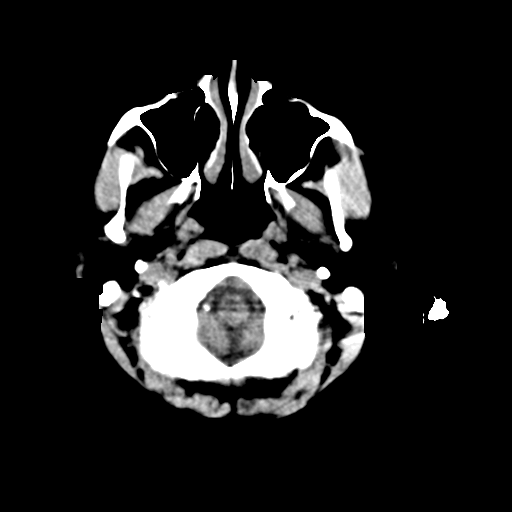
[im 3/30  bone]
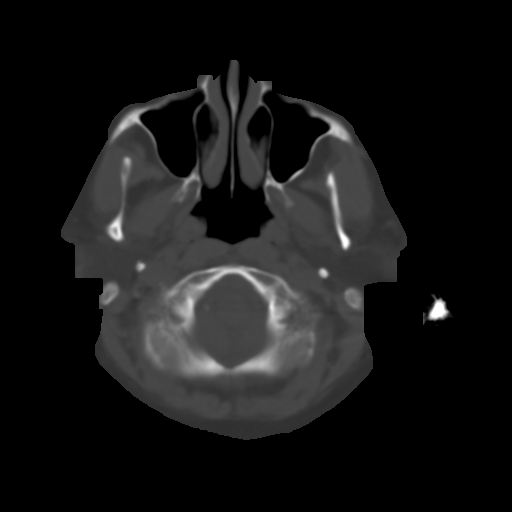
[im 6/30  brain]
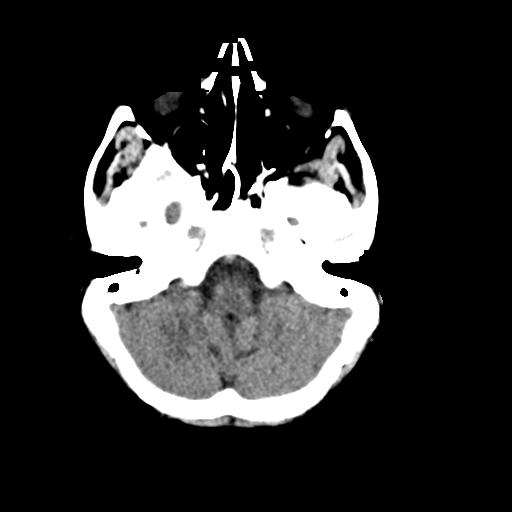
[im 9/30  brain]
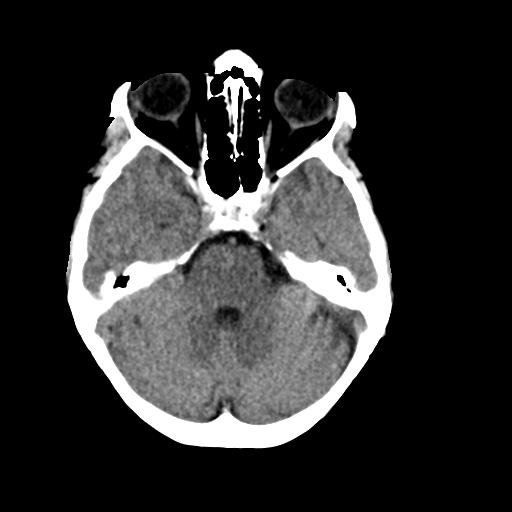
[im 12/30  brain]
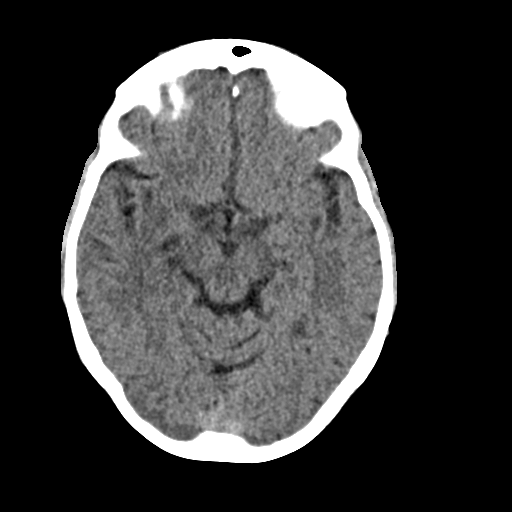
[im 16/30  brain]
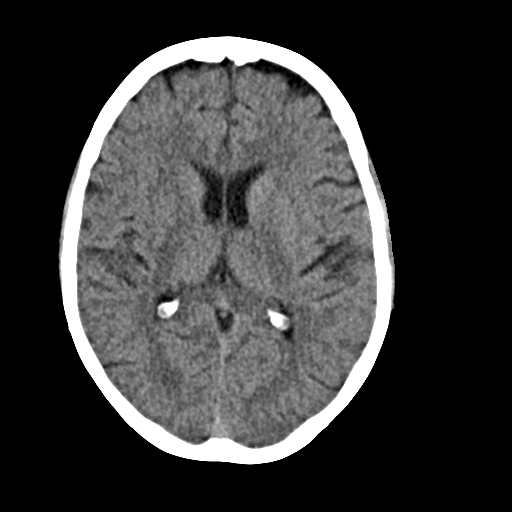
[im 16/30  bone]
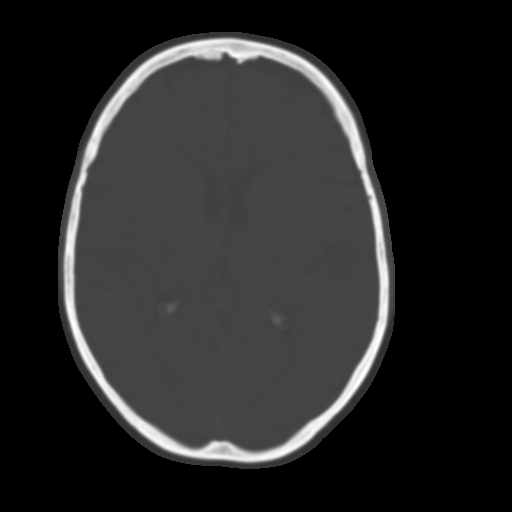
[im 19/30  brain]
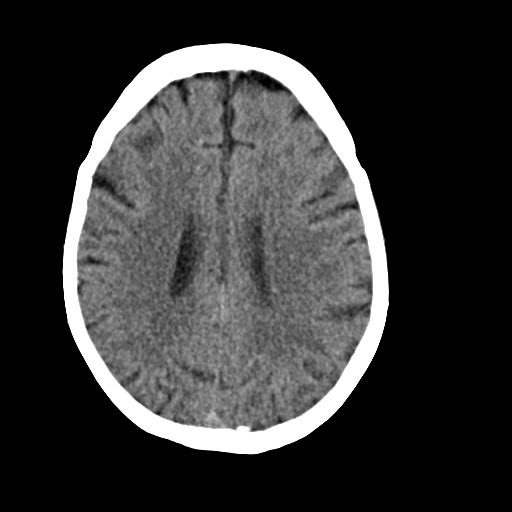
[im 22/30  brain]
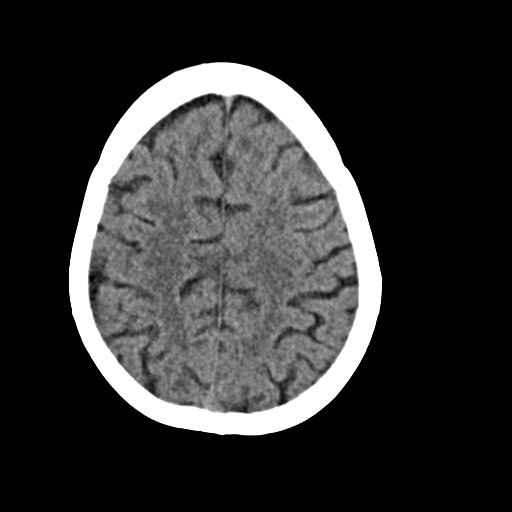
[im 25/30  brain]
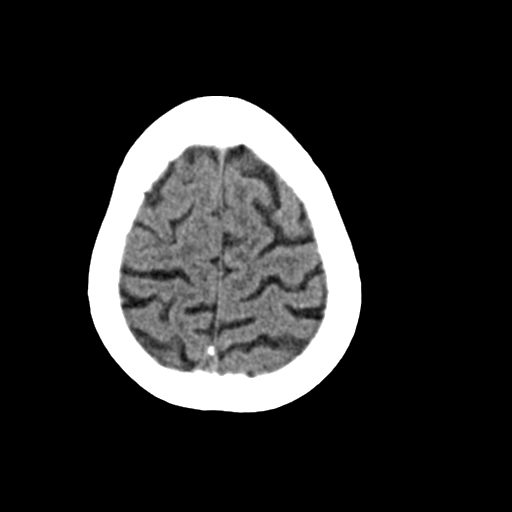
[im 28/30  brain]
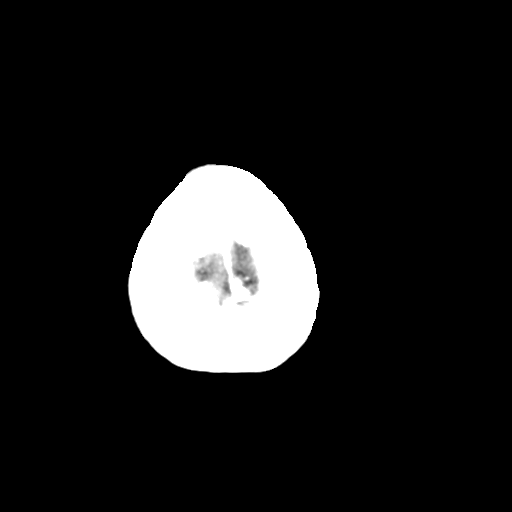
[im 28/30  bone]
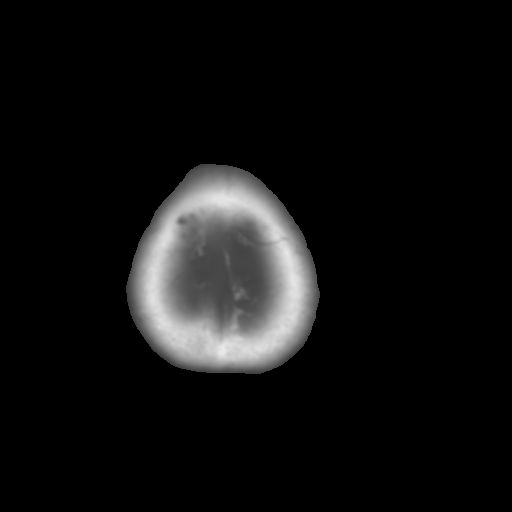

[Series 4: coronal soft · coronal · 0.30mm/px · 3 of 67 slices shown]
[im 23/67  brain]
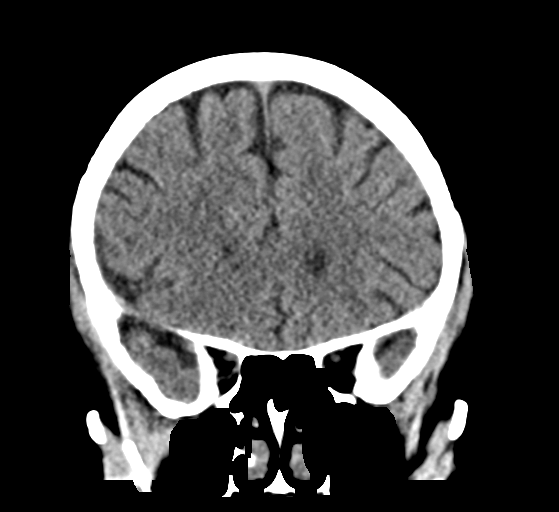
[im 30/67  brain]
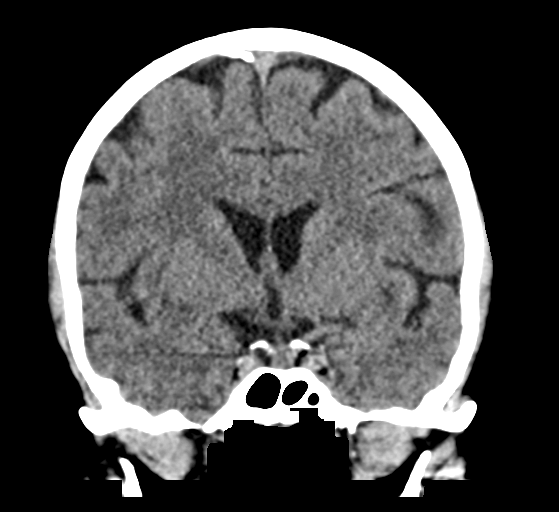
[im 37/67  brain]
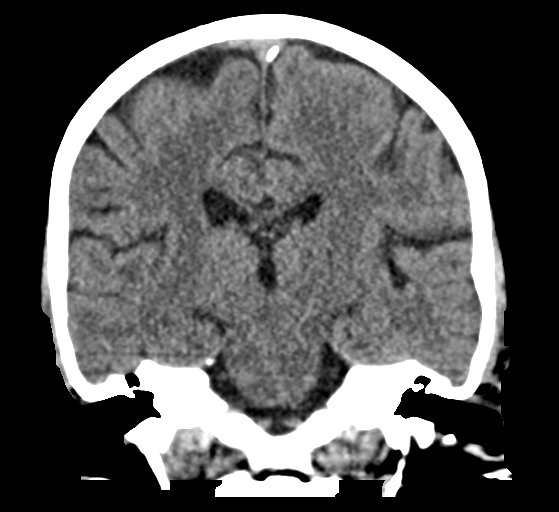

[Series 5: sag soft · sagittal · 0.29mm/px · 3 of 57 slices shown]
[im 19/57  brain]
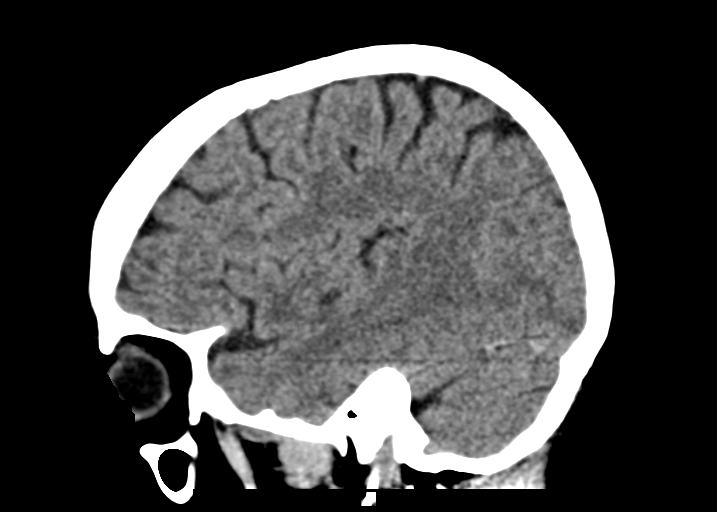
[im 29/57  brain]
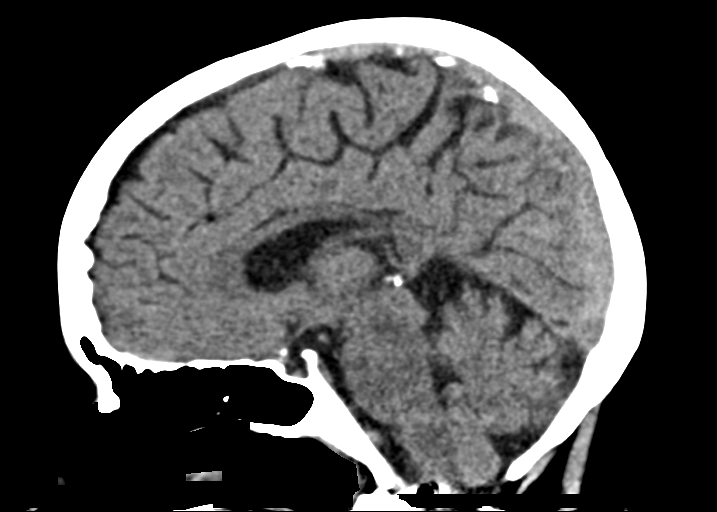
[im 38/57  brain]
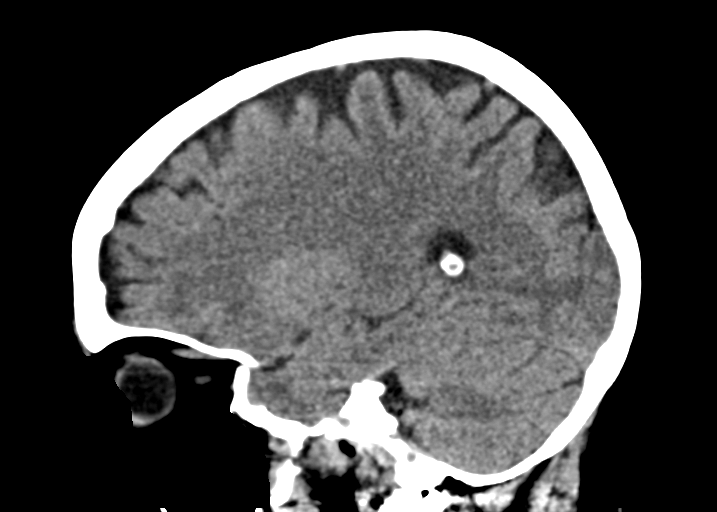

[15 of 47 positions shown; findings below may reference images not displayed]

FINDINGS: Brain: There is no acute intracranial hemorrhage, mass effect, or
edema. Gray-white differentiation is preserved. There is no
extra-axial fluid collection. Ventricles and sulci are within normal
limits in size and configuration. Foci of low attenuation in the
subinsular white matter bilaterally may reflect prominent
perivascular spaces or chronic small vessel infarcts.

Vascular: There is minor atherosclerotic calcification at the skull
base.

Skull: Calvarium is unremarkable.

Sinuses/Orbits: No acute finding.

Other: None.
IMPRESSION: No acute intracranial hemorrhage, mass effect, or evidence of acute
infarction.

## 2021-06-10 IMAGING — DX DG CHEST 2V
2 series · 2 of 2 positions shown · non-contrast
Comparison: Prior chest radiograph 10/19/2018

CLINICAL DATA: Chest pain. Additional provided: Dizziness,
shortness of breath for 1 week.

EXAM:
CHEST - 2 VIEW

[chest pa]
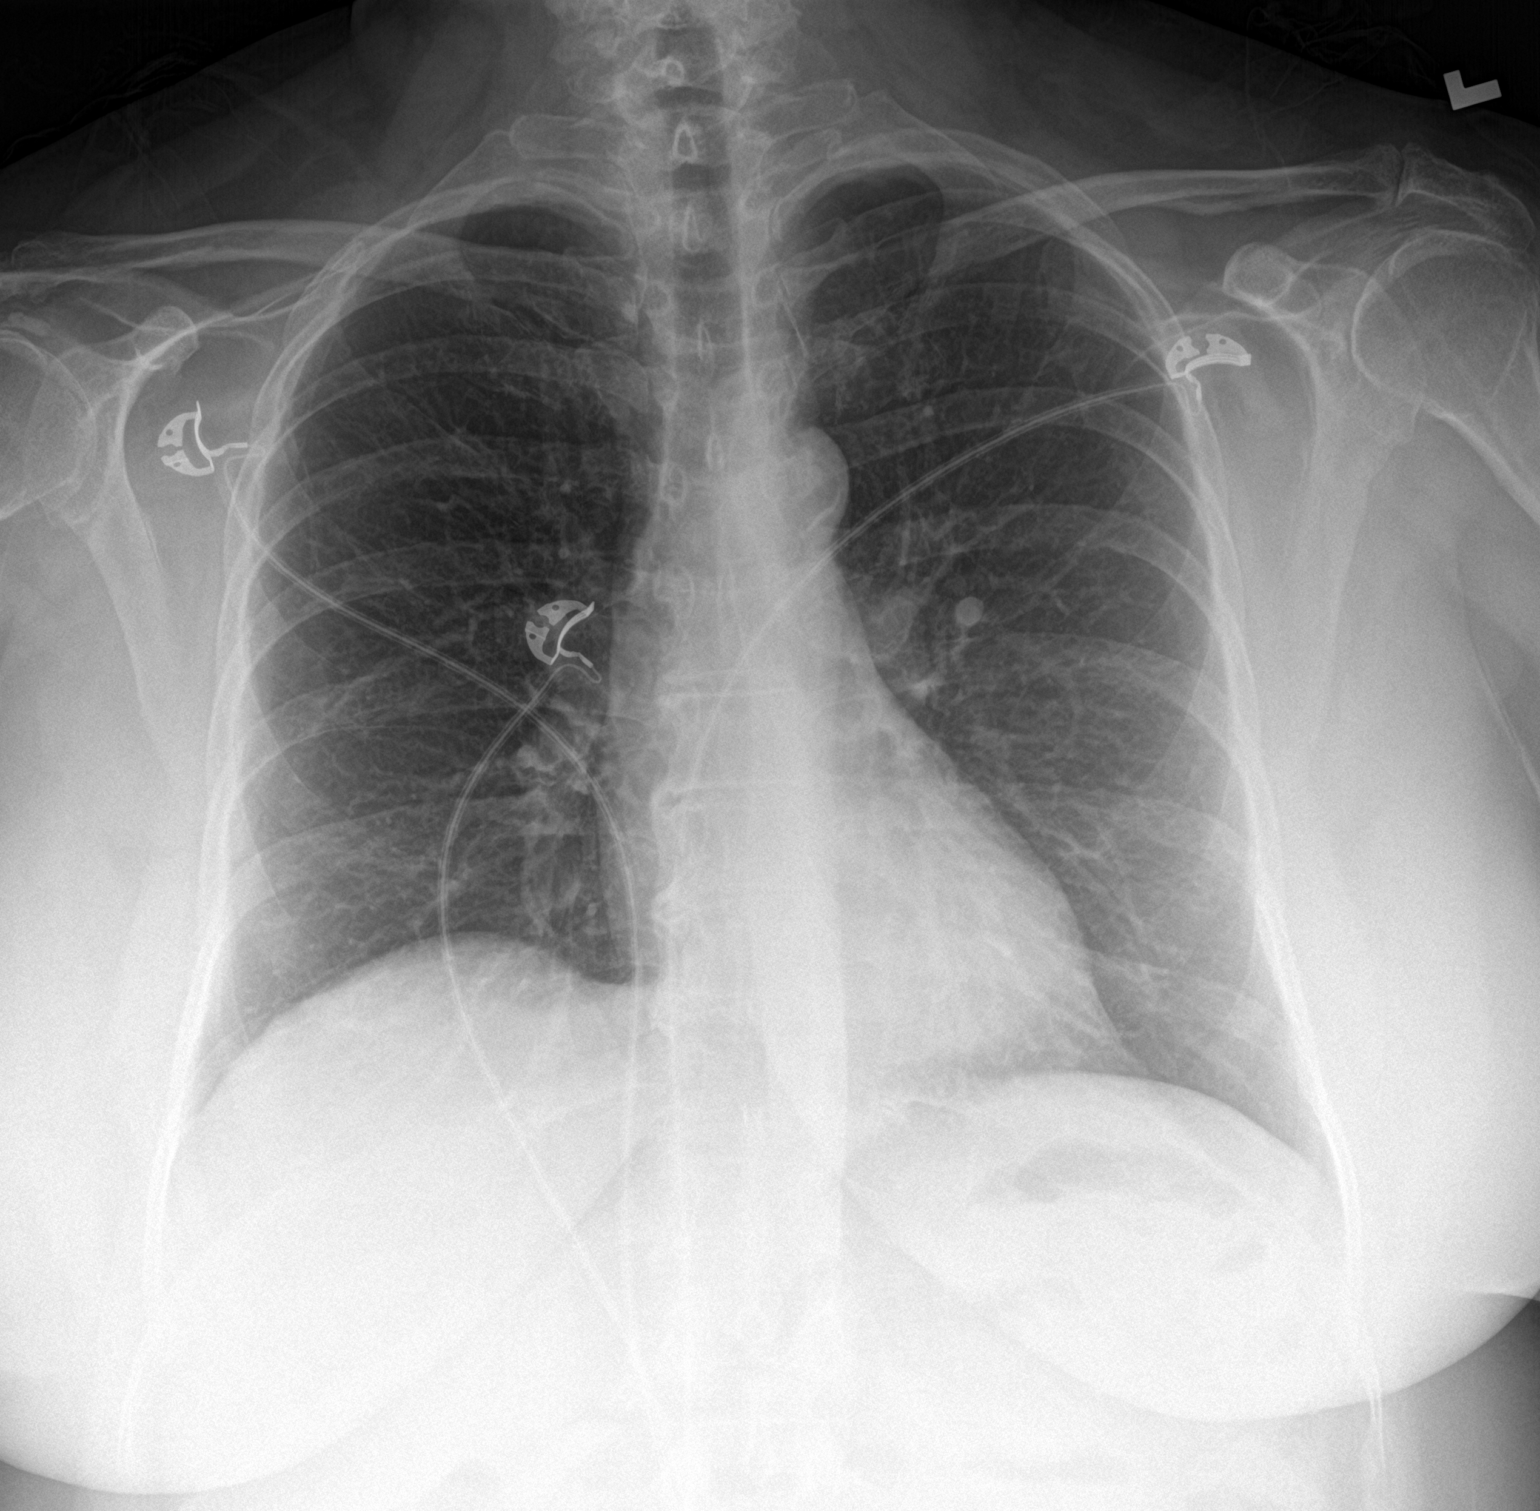

[chest lat]
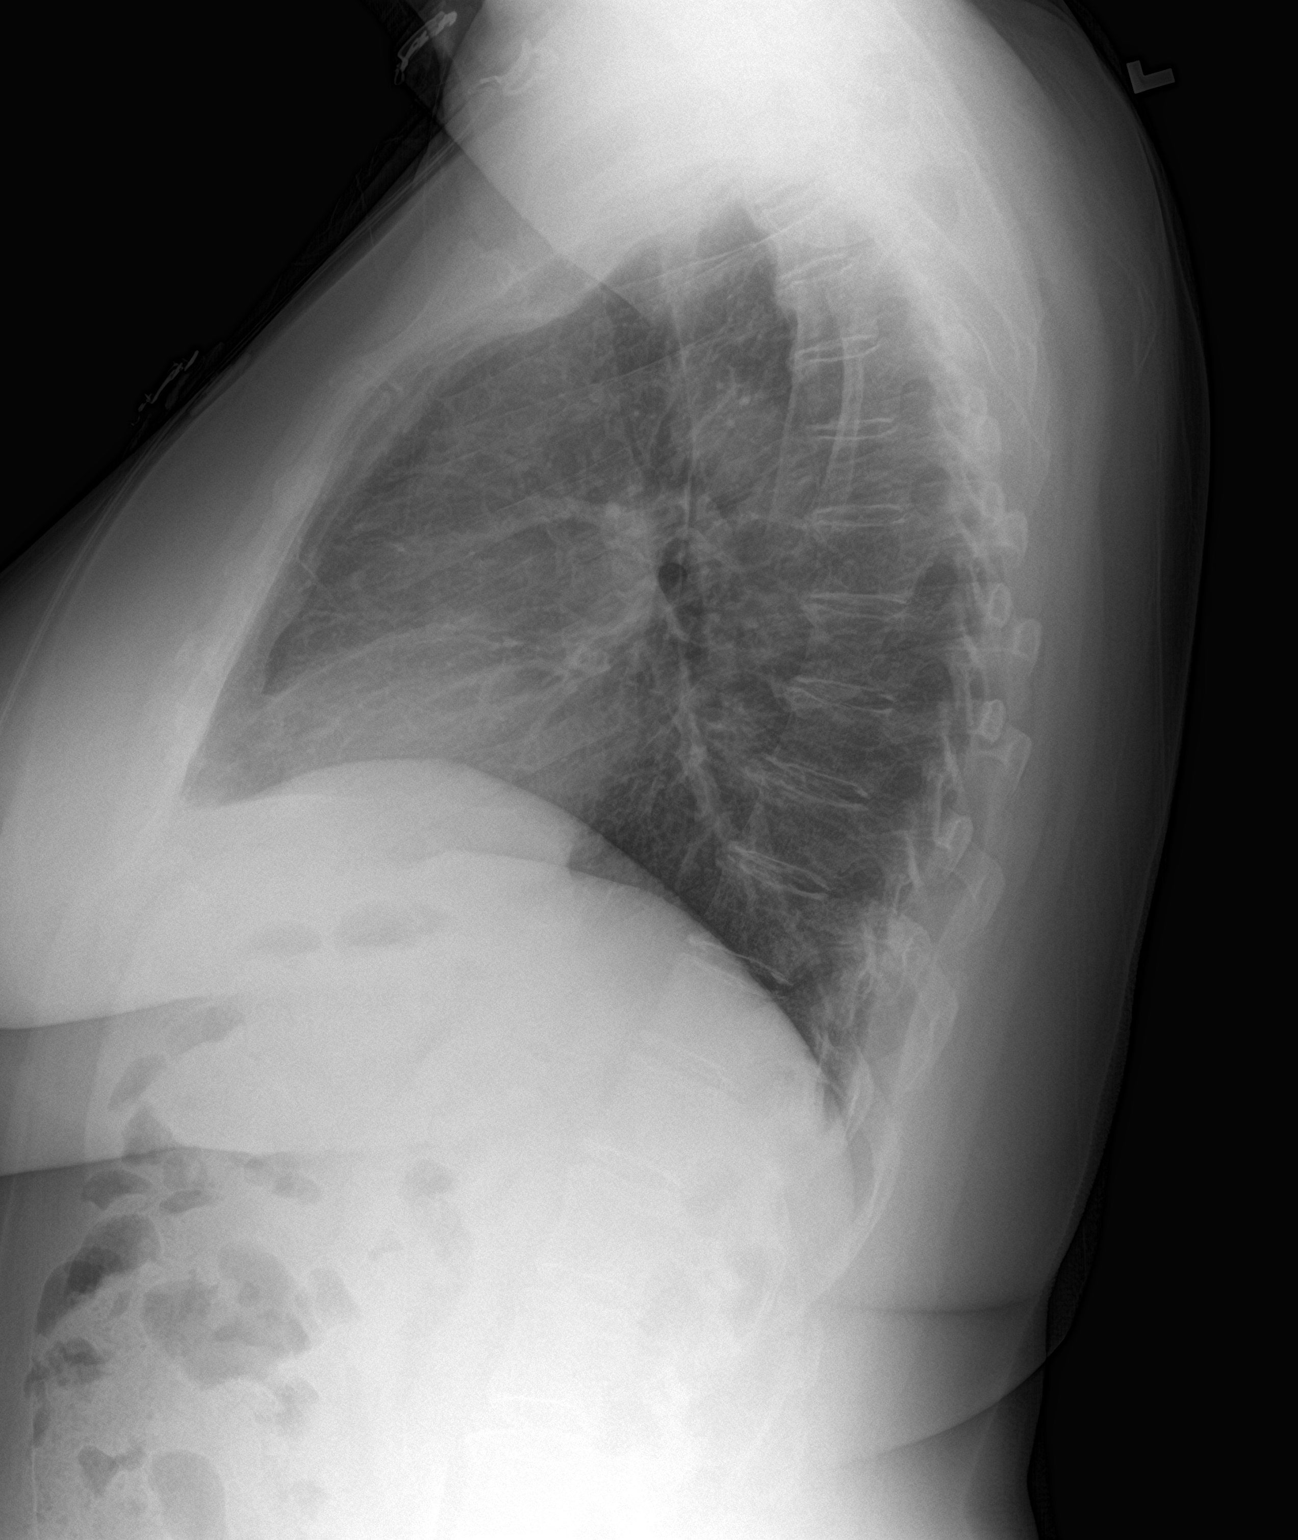

[2 of 2 positions shown; findings below may reference images not displayed]

FINDINGS: Heart size within normal limits. Aortic atherosclerosis.

There is no appreciable airspace consolidation.

No evidence of pleural effusion or pneumothorax.

No acute bony abnormality identified.  Thoracic spondylosis.
IMPRESSION: No evidence of acute cardiopulmonary abnormality.

Aortic Atherosclerosis ([id number]).

Thoracic spondylosis.
